# Patient Record
Sex: Female | Born: 1965 | Race: Black or African American | Hispanic: No | Marital: Single | Smoking: Former smoker
Health system: Southern US, Community
[De-identification: ages and names within clinical notes are randomized; demographics above are authoritative.]

## PROBLEM LIST (undated history)

## (undated) DIAGNOSIS — K469 Unspecified abdominal hernia without obstruction or gangrene: Secondary | ICD-10-CM

## (undated) DIAGNOSIS — F329 Major depressive disorder, single episode, unspecified: Secondary | ICD-10-CM

## (undated) DIAGNOSIS — F1020 Alcohol dependence, uncomplicated: Secondary | ICD-10-CM

## (undated) DIAGNOSIS — I10 Essential (primary) hypertension: Secondary | ICD-10-CM

## (undated) DIAGNOSIS — N920 Excessive and frequent menstruation with regular cycle: Secondary | ICD-10-CM

## (undated) DIAGNOSIS — D219 Benign neoplasm of connective and other soft tissue, unspecified: Secondary | ICD-10-CM

## (undated) DIAGNOSIS — D649 Anemia, unspecified: Secondary | ICD-10-CM

## (undated) DIAGNOSIS — F32A Depression, unspecified: Secondary | ICD-10-CM

## (undated) HISTORY — PX: ABDOMINAL HYSTERECTOMY: SHX81

## (undated) HISTORY — DX: Depression, unspecified: F32.A

## (undated) HISTORY — DX: Anemia, unspecified: D64.9

## (undated) HISTORY — PX: CHOLECYSTECTOMY: SHX55

## (undated) HISTORY — DX: Excessive and frequent menstruation with regular cycle: N92.0

## (undated) HISTORY — DX: Major depressive disorder, single episode, unspecified: F32.9

## (undated) HISTORY — DX: Unspecified abdominal hernia without obstruction or gangrene: K46.9

## (undated) HISTORY — DX: Benign neoplasm of connective and other soft tissue, unspecified: D21.9

---

## 2008-08-24 ENCOUNTER — Emergency Department (HOSPITAL_BASED_OUTPATIENT_CLINIC_OR_DEPARTMENT_OTHER): Admission: EM | Admit: 2008-08-24 | Discharge: 2008-08-24 | Payer: Self-pay | Admitting: Emergency Medicine

## 2008-08-24 ENCOUNTER — Ambulatory Visit: Payer: Self-pay | Admitting: Radiology

## 2008-09-20 ENCOUNTER — Emergency Department (HOSPITAL_BASED_OUTPATIENT_CLINIC_OR_DEPARTMENT_OTHER): Admission: EM | Admit: 2008-09-20 | Discharge: 2008-09-21 | Payer: Self-pay | Admitting: Emergency Medicine

## 2009-07-09 ENCOUNTER — Inpatient Hospital Stay (HOSPITAL_COMMUNITY): Admission: AD | Admit: 2009-07-09 | Discharge: 2009-07-12 | Payer: Self-pay | Admitting: Family Medicine

## 2009-07-09 ENCOUNTER — Ambulatory Visit: Payer: Self-pay | Admitting: Obstetrics & Gynecology

## 2010-06-12 LAB — CBC
HCT: 20.7 % — ABNORMAL LOW (ref 36.0–46.0)
Hemoglobin: 6.1 g/dL — CL (ref 12.0–15.0)
Hemoglobin: 6.1 g/dL — CL (ref 12.0–15.0)
MCHC: 29.5 g/dL — ABNORMAL LOW (ref 30.0–36.0)
MCV: 58.4 fL — ABNORMAL LOW (ref 78.0–100.0)
MCV: 58.7 fL — ABNORMAL LOW (ref 78.0–100.0)
MCV: 59.5 fL — ABNORMAL LOW (ref 78.0–100.0)
Platelets: 213 10*3/uL (ref 150–400)
Platelets: 218 10*3/uL (ref 150–400)
RBC: 3.44 MIL/uL — ABNORMAL LOW (ref 3.87–5.11)
RBC: 3.48 MIL/uL — ABNORMAL LOW (ref 3.87–5.11)
RDW: 24.1 % — ABNORMAL HIGH (ref 11.5–15.5)
RDW: 24.3 % — ABNORMAL HIGH (ref 11.5–15.5)
WBC: 12.4 10*3/uL — ABNORMAL HIGH (ref 4.0–10.5)

## 2010-06-12 LAB — COMPREHENSIVE METABOLIC PANEL
AST: 46 U/L — ABNORMAL HIGH (ref 0–37)
AST: 91 U/L — ABNORMAL HIGH (ref 0–37)
Albumin: 2.5 g/dL — ABNORMAL LOW (ref 3.5–5.2)
Alkaline Phosphatase: 47 U/L (ref 39–117)
BUN: 6 mg/dL (ref 6–23)
CO2: 25 mEq/L (ref 19–32)
Calcium: 7.9 mg/dL — ABNORMAL LOW (ref 8.4–10.5)
Calcium: 8.1 mg/dL — ABNORMAL LOW (ref 8.4–10.5)
Chloride: 97 mEq/L (ref 96–112)
Creatinine, Ser: 0.91 mg/dL (ref 0.4–1.2)
Creatinine, Ser: 1.01 mg/dL (ref 0.4–1.2)
GFR calc Af Amer: >60 mL/min (ref >60)
GFR calc non Af Amer: >60 mL/min (ref >60)
Potassium: 2.6 mEq/L — CL (ref 3.5–5.1)
Sodium: 133 mEq/L — ABNORMAL LOW (ref 135–145)
Total Bilirubin: 0.3 mg/dL (ref 0.3–1.2)
Total Bilirubin: 0.4 mg/dL (ref 0.3–1.2)
Total Protein: 6.1 g/dL (ref 6.0–8.3)
Total Protein: 6.5 g/dL (ref 6.0–8.3)

## 2010-06-12 LAB — URINALYSIS, ROUTINE W REFLEX MICROSCOPIC
Bilirubin Urine: NEGATIVE
Glucose, UA: NEGATIVE mg/dL
Hgb urine dipstick: NEGATIVE
Ketones, ur: NEGATIVE mg/dL
Protein, ur: NEGATIVE mg/dL
pH: 6 (ref 5.0–8.0)

## 2010-06-12 LAB — POCT PREGNANCY, URINE: Preg Test, Ur: NEGATIVE

## 2010-06-12 LAB — RAPID URINE DRUG SCREEN, HOSP PERFORMED
Amphetamines: NOT DETECTED
Benzodiazepines: NOT DETECTED
Cocaine: POSITIVE — AB
Tetrahydrocannabinol: NOT DETECTED

## 2010-06-12 LAB — HEPATITIS C VRS RNA DETECT BY PCR-QUAL: Hepatitis C Vrs RNA by PCR-Qual: POSITIVE — AB

## 2010-06-12 LAB — RPR: RPR Ser Ql: NONREACTIVE

## 2010-06-12 LAB — WET PREP, GENITAL
Trich, Wet Prep: NONE SEEN
Yeast Wet Prep HPF POC: NONE SEEN

## 2010-06-12 LAB — PROTIME-INR: Prothrombin Time: 14 seconds (ref 11.6–15.2)

## 2010-07-02 LAB — BASIC METABOLIC PANEL
CO2: 27 mEq/L (ref 19–32)
Calcium: 9.1 mg/dL (ref 8.4–10.5)
GFR calc non Af Amer: 50 mL/min — ABNORMAL LOW (ref >60)
Glucose, Bld: 94 mg/dL (ref 70–99)
Sodium: 141 mEq/L (ref 135–145)

## 2010-07-02 LAB — URINE MICROSCOPIC-ADD ON

## 2010-07-02 LAB — CBC
Hemoglobin: 8.4 g/dL — ABNORMAL LOW (ref 12.0–15.0)
MCHC: 30 g/dL (ref 30.0–36.0)
MCV: 62.3 fL — ABNORMAL LOW (ref 78.0–100.0)
Platelets: 362 10*3/uL (ref 150–400)
RDW: 20.2 % — ABNORMAL HIGH (ref 11.5–15.5)
WBC: 5.5 10*3/uL (ref 4.0–10.5)

## 2010-07-02 LAB — URINALYSIS, ROUTINE W REFLEX MICROSCOPIC
Bilirubin Urine: NEGATIVE
Protein, ur: NEGATIVE mg/dL
Specific Gravity, Urine: 1.007 (ref 1.005–1.030)
pH: 6 (ref 5.0–8.0)

## 2010-07-02 LAB — DIFFERENTIAL
Basophils Relative: 0 % (ref 0–1)
Eosinophils Absolute: 0.1 10*3/uL (ref 0.0–0.7)
Monocytes Absolute: 0.4 10*3/uL (ref 0.1–1.0)
Neutrophils Relative %: 64 % (ref 43–77)
Promyelocytes Absolute: 0 %

## 2010-07-02 LAB — PREGNANCY, URINE: Preg Test, Ur: NEGATIVE

## 2010-10-01 ENCOUNTER — Emergency Department (HOSPITAL_COMMUNITY)
Admission: EM | Admit: 2010-10-01 | Discharge: 2010-10-02 | Disposition: A | Discharge status: Home / Self Care | Payer: No Typology Code available for payment source | Attending: Emergency Medicine | Admitting: Emergency Medicine

## 2010-10-01 DIAGNOSIS — R030 Elevated blood-pressure reading, without diagnosis of hypertension: Secondary | ICD-10-CM | POA: Insufficient documentation

## 2010-10-01 DIAGNOSIS — M25579 Pain in unspecified ankle and joints of unspecified foot: Secondary | ICD-10-CM | POA: Insufficient documentation

## 2010-10-01 DIAGNOSIS — R51 Headache: Secondary | ICD-10-CM | POA: Insufficient documentation

## 2010-10-01 DIAGNOSIS — M533 Sacrococcygeal disorders, not elsewhere classified: Secondary | ICD-10-CM | POA: Insufficient documentation

## 2010-10-01 DIAGNOSIS — M79609 Pain in unspecified limb: Secondary | ICD-10-CM | POA: Insufficient documentation

## 2010-10-01 DIAGNOSIS — Z8659 Personal history of other mental and behavioral disorders: Secondary | ICD-10-CM | POA: Insufficient documentation

## 2010-10-01 DIAGNOSIS — S139XXA Sprain of joints and ligaments of unspecified parts of neck, initial encounter: Secondary | ICD-10-CM | POA: Insufficient documentation

## 2010-10-01 DIAGNOSIS — T07XXXA Unspecified multiple injuries, initial encounter: Secondary | ICD-10-CM | POA: Insufficient documentation

## 2010-10-01 DIAGNOSIS — F319 Bipolar disorder, unspecified: Secondary | ICD-10-CM | POA: Insufficient documentation

## 2010-10-01 DIAGNOSIS — M542 Cervicalgia: Secondary | ICD-10-CM | POA: Insufficient documentation

## 2010-10-01 DIAGNOSIS — IMO0002 Reserved for concepts with insufficient information to code with codable children: Secondary | ICD-10-CM | POA: Insufficient documentation

## 2010-10-01 DIAGNOSIS — S060X0A Concussion without loss of consciousness, initial encounter: Secondary | ICD-10-CM | POA: Insufficient documentation

## 2010-10-01 DIAGNOSIS — M25569 Pain in unspecified knee: Secondary | ICD-10-CM | POA: Insufficient documentation

## 2010-10-02 ENCOUNTER — Emergency Department (HOSPITAL_COMMUNITY): Payer: No Typology Code available for payment source

## 2010-10-09 ENCOUNTER — Other Ambulatory Visit (HOSPITAL_COMMUNITY): Payer: Self-pay | Admitting: Neurosurgery

## 2010-10-09 DIAGNOSIS — M542 Cervicalgia: Secondary | ICD-10-CM

## 2010-10-16 ENCOUNTER — Other Ambulatory Visit (HOSPITAL_COMMUNITY): Payer: Self-pay | Admitting: Neurosurgery

## 2010-10-16 ENCOUNTER — Ambulatory Visit (HOSPITAL_COMMUNITY)
Admission: RE | Admit: 2010-10-16 | Discharge: 2010-10-16 | Disposition: A | Discharge status: Home / Self Care | Payer: No Typology Code available for payment source | Source: Ambulatory Visit | Attending: Neurosurgery | Admitting: Neurosurgery

## 2010-10-16 DIAGNOSIS — M542 Cervicalgia: Secondary | ICD-10-CM | POA: Insufficient documentation

## 2010-10-16 DIAGNOSIS — M5020 Other cervical disc displacement, unspecified cervical region: Secondary | ICD-10-CM | POA: Insufficient documentation

## 2010-10-22 ENCOUNTER — Inpatient Hospital Stay (HOSPITAL_COMMUNITY)
Admission: RE | Admit: 2010-10-22 | Discharge: 2010-10-22 | Payer: No Typology Code available for payment source | Source: Ambulatory Visit | Attending: Neurosurgery | Admitting: Neurosurgery

## 2011-06-17 ENCOUNTER — Inpatient Hospital Stay (HOSPITAL_COMMUNITY)
Admission: EM | Admit: 2011-06-17 | Discharge: 2011-06-21 | DRG: 690 | Disposition: A | Discharge status: Home / Self Care | Payer: Medicaid Other | Attending: Internal Medicine | Admitting: Internal Medicine

## 2011-06-17 ENCOUNTER — Emergency Department (HOSPITAL_COMMUNITY): Payer: Medicaid Other

## 2011-06-17 ENCOUNTER — Encounter (HOSPITAL_COMMUNITY): Payer: Self-pay | Admitting: Emergency Medicine

## 2011-06-17 DIAGNOSIS — N920 Excessive and frequent menstruation with regular cycle: Secondary | ICD-10-CM | POA: Diagnosis present

## 2011-06-17 DIAGNOSIS — F411 Generalized anxiety disorder: Secondary | ICD-10-CM | POA: Diagnosis present

## 2011-06-17 DIAGNOSIS — E876 Hypokalemia: Secondary | ICD-10-CM

## 2011-06-17 DIAGNOSIS — F1020 Alcohol dependence, uncomplicated: Secondary | ICD-10-CM

## 2011-06-17 DIAGNOSIS — E860 Dehydration: Secondary | ICD-10-CM | POA: Diagnosis present

## 2011-06-17 DIAGNOSIS — F17200 Nicotine dependence, unspecified, uncomplicated: Secondary | ICD-10-CM | POA: Diagnosis present

## 2011-06-17 DIAGNOSIS — N10 Acute tubulo-interstitial nephritis: Principal | ICD-10-CM

## 2011-06-17 DIAGNOSIS — F1410 Cocaine abuse, uncomplicated: Secondary | ICD-10-CM | POA: Diagnosis present

## 2011-06-17 DIAGNOSIS — I10 Essential (primary) hypertension: Secondary | ICD-10-CM | POA: Diagnosis present

## 2011-06-17 DIAGNOSIS — A498 Other bacterial infections of unspecified site: Secondary | ICD-10-CM | POA: Diagnosis present

## 2011-06-17 DIAGNOSIS — D259 Leiomyoma of uterus, unspecified: Secondary | ICD-10-CM

## 2011-06-17 DIAGNOSIS — D500 Iron deficiency anemia secondary to blood loss (chronic): Secondary | ICD-10-CM | POA: Diagnosis present

## 2011-06-17 DIAGNOSIS — IMO0001 Reserved for inherently not codable concepts without codable children: Secondary | ICD-10-CM

## 2011-06-17 DIAGNOSIS — N179 Acute kidney failure, unspecified: Secondary | ICD-10-CM

## 2011-06-17 DIAGNOSIS — Z720 Tobacco use: Secondary | ICD-10-CM

## 2011-06-17 DIAGNOSIS — R197 Diarrhea, unspecified: Secondary | ICD-10-CM | POA: Diagnosis present

## 2011-06-17 HISTORY — DX: Essential (primary) hypertension: I10

## 2011-06-17 LAB — PROTIME-INR
INR: 0.99 (ref 0.00–1.49)
Prothrombin Time: 13.3 seconds (ref 11.6–15.2)

## 2011-06-17 LAB — URINALYSIS, ROUTINE W REFLEX MICROSCOPIC
Ketones, ur: NEGATIVE mg/dL
Nitrite: NEGATIVE
Protein, ur: 100 mg/dL — AB

## 2011-06-17 LAB — CBC
HCT: 30.2 % — ABNORMAL LOW (ref 36.0–46.0)
MCHC: 31.8 g/dL (ref 30.0–36.0)
RDW: 21.2 % — ABNORMAL HIGH (ref 11.5–15.5)

## 2011-06-17 LAB — URINE MICROSCOPIC-ADD ON

## 2011-06-17 LAB — DIFFERENTIAL
Basophils Absolute: 0 10*3/uL (ref 0.0–0.1)
Eosinophils Absolute: 0 10*3/uL (ref 0.0–0.7)
Lymphocytes Relative: 9 % — ABNORMAL LOW (ref 12–46)
Lymphs Abs: 1 10*3/uL (ref 0.7–4.0)
Neutro Abs: 8.9 10*3/uL — ABNORMAL HIGH (ref 1.7–7.7)

## 2011-06-17 LAB — COMPREHENSIVE METABOLIC PANEL
BUN: 17 mg/dL (ref 6–23)
Calcium: 9.1 mg/dL (ref 8.4–10.5)
GFR calc Af Amer: 59 mL/min — ABNORMAL LOW (ref >90)
Glucose, Bld: 125 mg/dL — ABNORMAL HIGH (ref 70–99)
Sodium: 130 mEq/L — ABNORMAL LOW (ref 135–145)
Total Protein: 8.3 g/dL (ref 6.0–8.3)

## 2011-06-17 LAB — LIPASE, BLOOD: Lipase: 68 U/L — ABNORMAL HIGH (ref 11–59)

## 2011-06-17 LAB — PREGNANCY, URINE: Preg Test, Ur: NEGATIVE

## 2011-06-17 MED ORDER — MORPHINE SULFATE 4 MG/ML IJ SOLN
4.0000 mg | Freq: Once | INTRAMUSCULAR | Status: AC
  Administered 2011-06-17: 4 mg via INTRAVENOUS
  Filled 2011-06-17: qty 1

## 2011-06-17 MED ORDER — ACETAMINOPHEN 325 MG PO TABS
650.0000 mg | ORAL_TABLET | Freq: Once | ORAL | Status: AC
  Administered 2011-06-17: 650 mg via ORAL
  Filled 2011-06-17: qty 2

## 2011-06-17 MED ORDER — IOHEXOL 300 MG/ML  SOLN
20.0000 mL | INTRAMUSCULAR | Status: AC
  Administered 2011-06-18: 20 mL via ORAL

## 2011-06-17 MED ORDER — LORAZEPAM 2 MG/ML IJ SOLN
1.0000 mg | Freq: Once | INTRAMUSCULAR | Status: AC
  Administered 2011-06-17: 1 mg via INTRAVENOUS
  Filled 2011-06-17: qty 1

## 2011-06-17 MED ORDER — ONDANSETRON HCL 4 MG/2ML IJ SOLN
4.0000 mg | Freq: Once | INTRAMUSCULAR | Status: AC
  Administered 2011-06-17: 4 mg via INTRAVENOUS
  Filled 2011-06-17: qty 2

## 2011-06-17 MED ORDER — SODIUM CHLORIDE 0.9 % IV BOLUS (SEPSIS)
1000.0000 mL | Freq: Once | INTRAVENOUS | Status: AC
  Administered 2011-06-17: 1000 mL via INTRAVENOUS

## 2011-06-17 NOTE — ED Notes (Signed)
Per ems-  Pt coming from home where she has been having pain all over but worse in her abdomen.  Pt having nausea, vomiting, and diarrhea. Pt states she had a small amount of rectal bleeding on Friday.

## 2011-06-17 NOTE — ED Notes (Signed)
Pt states that she has not drank any alcohol in the last three days. Pt states she normally drinks "a lot"

## 2011-06-17 NOTE — ED Notes (Signed)
Pt to CT

## 2011-06-17 NOTE — ED Notes (Signed)
Patient back from CT.

## 2011-06-17 NOTE — ED Notes (Signed)
Pt crying in pain and very restless and rolling around in bed.

## 2011-06-17 NOTE — ED Notes (Signed)
Pt back from CT

## 2011-06-18 ENCOUNTER — Encounter (HOSPITAL_COMMUNITY): Payer: Self-pay | Admitting: Internal Medicine

## 2011-06-18 DIAGNOSIS — E876 Hypokalemia: Secondary | ICD-10-CM | POA: Diagnosis present

## 2011-06-18 DIAGNOSIS — N179 Acute kidney failure, unspecified: Secondary | ICD-10-CM | POA: Diagnosis present

## 2011-06-18 DIAGNOSIS — N10 Acute tubulo-interstitial nephritis: Secondary | ICD-10-CM | POA: Diagnosis present

## 2011-06-18 DIAGNOSIS — F1020 Alcohol dependence, uncomplicated: Secondary | ICD-10-CM | POA: Diagnosis present

## 2011-06-18 DIAGNOSIS — Z720 Tobacco use: Secondary | ICD-10-CM | POA: Diagnosis present

## 2011-06-18 LAB — CBC
HCT: 26.1 % — ABNORMAL LOW (ref 36.0–46.0)
Hemoglobin: 8.1 g/dL — ABNORMAL LOW (ref 12.0–15.0)
RBC: 4.24 MIL/uL (ref 3.87–5.11)
WBC: 8.2 10*3/uL (ref 4.0–10.5)

## 2011-06-18 LAB — RETICULOCYTES
RBC.: 3.92 MIL/uL (ref 3.87–5.11)
Retic Ct Pct: <0.4 % — ABNORMAL LOW (ref 0.4–3.1)

## 2011-06-18 LAB — MAGNESIUM: Magnesium: 1.9 mg/dL (ref 1.5–2.5)

## 2011-06-18 LAB — ETHANOL: Alcohol, Ethyl (B): <11 mg/dL (ref 0–11)

## 2011-06-18 LAB — COMPREHENSIVE METABOLIC PANEL
Albumin: 2.4 g/dL — ABNORMAL LOW (ref 3.5–5.2)
BUN: 15 mg/dL (ref 6–23)
Calcium: 8.5 mg/dL (ref 8.4–10.5)
GFR calc Af Amer: 65 mL/min — ABNORMAL LOW (ref >90)
Glucose, Bld: 167 mg/dL — ABNORMAL HIGH (ref 70–99)
Total Protein: 7 g/dL (ref 6.0–8.3)

## 2011-06-18 MED ORDER — KCL IN DEXTROSE-NACL 20-5-0.9 MEQ/L-%-% IV SOLN
INTRAVENOUS | Status: DC
  Filled 2011-06-18 (×3): qty 1000

## 2011-06-18 MED ORDER — VITAMIN B-1 100 MG PO TABS
100.0000 mg | ORAL_TABLET | Freq: Every day | ORAL | Status: DC
  Administered 2011-06-18 – 2011-06-21 (×4): 100 mg via ORAL
  Filled 2011-06-18 (×4): qty 1

## 2011-06-18 MED ORDER — ADULT MULTIVITAMIN W/MINERALS CH
1.0000 | ORAL_TABLET | Freq: Every day | ORAL | Status: DC
  Administered 2011-06-18 – 2011-06-21 (×4): 1 via ORAL
  Filled 2011-06-18 (×4): qty 1

## 2011-06-18 MED ORDER — IOHEXOL 300 MG/ML  SOLN
100.0000 mL | Freq: Once | INTRAMUSCULAR | Status: AC | PRN
  Administered 2011-06-18: 100 mL via INTRAVENOUS

## 2011-06-18 MED ORDER — CIPROFLOXACIN IN D5W 400 MG/200ML IV SOLN
400.0000 mg | Freq: Once | INTRAVENOUS | Status: AC
  Administered 2011-06-18: 400 mg via INTRAVENOUS
  Filled 2011-06-18: qty 200

## 2011-06-18 MED ORDER — ALBUTEROL SULFATE (5 MG/ML) 0.5% IN NEBU: 2.5000 mg | INHALATION_SOLUTION | RESPIRATORY_TRACT | Status: DC | PRN

## 2011-06-18 MED ORDER — LORAZEPAM 2 MG/ML IJ SOLN
1.0000 mg | Freq: Four times a day (QID) | INTRAMUSCULAR | Status: AC | PRN
  Administered 2011-06-18: 1 mg via INTRAVENOUS
  Filled 2011-06-18 (×3): qty 1

## 2011-06-18 MED ORDER — ONDANSETRON HCL 4 MG PO TABS: 4.0000 mg | ORAL_TABLET | Freq: Four times a day (QID) | ORAL | Status: DC | PRN

## 2011-06-18 MED ORDER — POTASSIUM CHLORIDE 10 MEQ/100ML IV SOLN
10.0000 meq | INTRAVENOUS | Status: AC
  Administered 2011-06-18 (×4): 10 meq via INTRAVENOUS
  Filled 2011-06-18 (×4): qty 100

## 2011-06-18 MED ORDER — THIAMINE HCL 100 MG/ML IJ SOLN
100.0000 mg | Freq: Every day | INTRAMUSCULAR | Status: DC
  Filled 2011-06-18 (×3): qty 1

## 2011-06-18 MED ORDER — DOCUSATE SODIUM 100 MG PO CAPS
100.0000 mg | ORAL_CAPSULE | Freq: Two times a day (BID) | ORAL | Status: DC
  Administered 2011-06-18 (×2): 100 mg via ORAL
  Filled 2011-06-18 (×4): qty 1

## 2011-06-18 MED ORDER — FOLIC ACID 1 MG PO TABS
1.0000 mg | ORAL_TABLET | Freq: Every day | ORAL | Status: DC
  Administered 2011-06-18 – 2011-06-21 (×4): 1 mg via ORAL
  Filled 2011-06-18 (×4): qty 1

## 2011-06-18 MED ORDER — MORPHINE SULFATE 4 MG/ML IJ SOLN
4.0000 mg | Freq: Once | INTRAMUSCULAR | Status: AC
  Administered 2011-06-18: 4 mg via INTRAVENOUS
  Filled 2011-06-18: qty 1

## 2011-06-18 MED ORDER — POTASSIUM CHLORIDE CRYS ER 20 MEQ PO TBCR
40.0000 meq | EXTENDED_RELEASE_TABLET | Freq: Once | ORAL | Status: AC
  Administered 2011-06-18: 40 meq via ORAL
  Filled 2011-06-18: qty 2

## 2011-06-18 MED ORDER — ACETAMINOPHEN 650 MG RE SUPP: 650.0000 mg | Freq: Four times a day (QID) | RECTAL | Status: DC | PRN

## 2011-06-18 MED ORDER — MORPHINE SULFATE 4 MG/ML IJ SOLN
4.0000 mg | INTRAMUSCULAR | Status: DC | PRN
  Administered 2011-06-18 – 2011-06-21 (×12): 4 mg via INTRAVENOUS
  Filled 2011-06-18 (×12): qty 1

## 2011-06-18 MED ORDER — POTASSIUM PHOSPHATE DIBASIC 3 MMOLE/ML IV SOLN
20.0000 mmol | Freq: Once | INTRAVENOUS | Status: AC
  Administered 2011-06-18: 20 mmol via INTRAVENOUS
  Filled 2011-06-18: qty 6.67

## 2011-06-18 MED ORDER — LORAZEPAM 2 MG/ML IJ SOLN
0.5000 mg | Freq: Once | INTRAMUSCULAR | Status: AC
  Administered 2011-06-18: 0.5 mg via INTRAVENOUS
  Filled 2011-06-18: qty 1

## 2011-06-18 MED ORDER — PIPERACILLIN-TAZOBACTAM 3.375 G IVPB
3.3750 g | Freq: Three times a day (TID) | INTRAVENOUS | Status: DC
  Administered 2011-06-18 – 2011-06-20 (×7): 3.375 g via INTRAVENOUS
  Filled 2011-06-18 (×9): qty 50

## 2011-06-18 MED ORDER — SODIUM CHLORIDE 0.9 % IV SOLN: INTRAVENOUS | Status: AC

## 2011-06-18 MED ORDER — LORAZEPAM 2 MG/ML IJ SOLN: 0.0000 mg | Freq: Two times a day (BID) | INTRAMUSCULAR | Status: DC

## 2011-06-18 MED ORDER — ONDANSETRON HCL 4 MG/2ML IJ SOLN: 4.0000 mg | Freq: Three times a day (TID) | INTRAMUSCULAR | Status: DC | PRN

## 2011-06-18 MED ORDER — NICOTINE 21 MG/24HR TD PT24
21.0000 mg | MEDICATED_PATCH | Freq: Every day | TRANSDERMAL | Status: DC
  Filled 2011-06-18: qty 1

## 2011-06-18 MED ORDER — ACETAMINOPHEN 325 MG PO TABS
650.0000 mg | ORAL_TABLET | Freq: Four times a day (QID) | ORAL | Status: DC | PRN
  Administered 2011-06-21: 650 mg via ORAL
  Filled 2011-06-18 (×2): qty 2

## 2011-06-18 MED ORDER — MORPHINE SULFATE 2 MG/ML IJ SOLN
1.0000 mg | INTRAMUSCULAR | Status: DC | PRN
  Administered 2011-06-18 (×3): 1 mg via INTRAVENOUS
  Filled 2011-06-18 (×3): qty 1

## 2011-06-18 MED ORDER — LORAZEPAM 1 MG PO TABS: 1.0000 mg | ORAL_TABLET | Freq: Four times a day (QID) | ORAL | Status: AC | PRN

## 2011-06-18 MED ORDER — LORAZEPAM 2 MG/ML IJ SOLN
0.0000 mg | Freq: Four times a day (QID) | INTRAMUSCULAR | Status: AC
  Administered 2011-06-18 – 2011-06-19 (×4): 2 mg via INTRAVENOUS
  Filled 2011-06-18 (×2): qty 1

## 2011-06-18 MED ORDER — ONDANSETRON HCL 4 MG/2ML IJ SOLN
4.0000 mg | Freq: Four times a day (QID) | INTRAMUSCULAR | Status: DC | PRN
  Administered 2011-06-19: 4 mg via INTRAVENOUS
  Filled 2011-06-18: qty 2

## 2011-06-18 MED ORDER — THIAMINE HCL 100 MG/ML IJ SOLN
Freq: Once | INTRAVENOUS | Status: AC
  Administered 2011-06-18: 05:00:00 via INTRAVENOUS
  Filled 2011-06-18: qty 1000

## 2011-06-18 MED ORDER — SODIUM CHLORIDE 0.9 % IV SOLN
INTRAVENOUS | Status: DC
  Administered 2011-06-18: 17:00:00 via INTRAVENOUS
  Administered 2011-06-19: 1000 mL via INTRAVENOUS
  Administered 2011-06-19: 10:00:00 via INTRAVENOUS

## 2011-06-18 MED ORDER — NICOTINE 21 MG/24HR TD PT24
21.0000 mg | MEDICATED_PATCH | TRANSDERMAL | Status: DC
  Administered 2011-06-18 – 2011-06-20 (×3): 21 mg via TRANSDERMAL
  Filled 2011-06-18 (×4): qty 1

## 2011-06-18 MED ORDER — ALUM & MAG HYDROXIDE-SIMETH 200-200-20 MG/5ML PO SUSP: 30.0000 mL | Freq: Four times a day (QID) | ORAL | Status: DC | PRN

## 2011-06-18 NOTE — ED Provider Notes (Signed)
History     CSN: [id number]  Arrival date & time 06/17/11  1746   First MD Initiated Contact with Patient 06/17/11 1804      Chief Complaint  Patient presents with  . Abdominal Pain    (Consider location/radiation/quality/duration/timing/severity/associated sxs/prior treatment) Patient is a 46 y.o. female presenting with abdominal pain. The history is provided by the patient.  Abdominal Pain The primary symptoms of the illness include abdominal pain, fever, nausea, vomiting and diarrhea. The primary symptoms of the illness do not include shortness of breath or dysuria. Episode onset: 3 days ago. The onset of the illness was gradual. The problem has been gradually worsening.  The illness is associated with alcohol use. The patient states that she believes she is currently not pregnant. The patient has had a change in bowel habit. Additional symptoms associated with the illness include chills, anorexia and back pain. Symptoms associated with the illness do not include urgency, hematuria or frequency. Associated symptoms comments: She reports having high fevers, unmeasured.  Also reports "head to toe pain".  She does have a history of heavy EtOH use, daily but no alcohol in 3 days.  She has had nausea vomiting and diarrhea.  She states she saw a small amount of pink streaking in her otherwise clear to yellow liquid stools 3 days ago, but none since..    Past Medical History  Diagnosis Date  . Hypertension     Past Surgical History  Procedure Date  . Cholecystectomy     History reviewed. No pertinent family history.  History  Substance Use Topics  . Smoking status: Current Everyday Smoker -- 2.0 packs/day    Types: Cigarettes  . Smokeless tobacco: Not on file  . Alcohol Use: Yes     pt states she drinks everyday "a lot"    OB History    Grav Para Term Preterm Abortions TAB SAB Ect Mult Living                  Review of Systems  Constitutional: Positive for fever and  chills.  HENT: Negative for congestion, sore throat and neck pain.   Eyes: Negative.   Respiratory: Negative for chest tightness and shortness of breath.   Cardiovascular: Negative for chest pain.  Gastrointestinal: Positive for nausea, vomiting, abdominal pain, diarrhea and anorexia. Negative for rectal pain.  Genitourinary: Negative.  Negative for dysuria, urgency, frequency, hematuria, difficulty urinating and vaginal pain.  Musculoskeletal: Positive for myalgias and back pain. Negative for joint swelling and arthralgias.  Skin: Negative.  Negative for rash and wound.  Neurological: Negative for dizziness, weakness, light-headedness, numbness and headaches.  Hematological: Negative.   Psychiatric/Behavioral: Negative.     Allergies  Review of patient's allergies indicates no known allergies.  Home Medications  No current outpatient prescriptions on file.  BP 129/82  Pulse 78  Temp(Src) 100.3 F (37.9 C) (Axillary)  Resp 24  Ht 5\' 9"  (1.753 m)  Wt 180 lb (81.647 kg)  BMI 26.58 kg/m2  SpO2 99%  LMP 05/30/2011  Physical Exam  Nursing note and vitals reviewed. Constitutional: She is oriented to person, place, and time. She appears well-developed and well-nourished.  HENT:  Head: Normocephalic and atraumatic.  Eyes: Conjunctivae are normal.  Neck: Normal range of motion.  Cardiovascular: Normal rate, regular rhythm, normal heart sounds and intact distal pulses.   Pulmonary/Chest: Effort normal and breath sounds normal. She has no wheezes.  Abdominal: Bowel sounds are normal. She exhibits no distension and no mass.  There is generalized tenderness. There is CVA tenderness. There is no rigidity, no rebound and no guarding.  Musculoskeletal: Normal range of motion.  Neurological: She is alert and oriented to person, place, and time.  Skin: Skin is warm and dry.  Psychiatric: Her mood appears anxious. She is agitated.    ED Course  Procedures (including critical care  time)  Labs Reviewed  CBC - Abnormal; Notable for the following:    WBC 11.0 (*)    Hemoglobin 9.6 (*)    HCT 30.2 (*)    MCV 60.6 (*)    MCH 19.3 (*)    RDW 21.2 (*)    Platelets 149 (*)    All other components within normal limits  DIFFERENTIAL - Abnormal; Notable for the following:    Neutrophils Relative 81 (*)    Lymphocytes Relative 9 (*)    Neutro Abs 8.9 (*)    Monocytes Absolute 1.1 (*)    All other components within normal limits  COMPREHENSIVE METABOLIC PANEL - Abnormal; Notable for the following:    Sodium 130 (*)    Potassium 3.4 (*)    Chloride 91 (*)    Glucose, Bld 125 (*)    Creatinine, Ser 1.26 (*)    Albumin 2.8 (*)    GFR calc non Af Amer 51 (*)    GFR calc Af Amer 59 (*)    All other components within normal limits  LIPASE, BLOOD - Abnormal; Notable for the following:    Lipase 68 (*)    All other components within normal limits  URINALYSIS, ROUTINE W REFLEX MICROSCOPIC - Abnormal; Notable for the following:    APPearance CLOUDY (*)    Hgb urine dipstick MODERATE (*)    Protein, ur 100 (*)    Leukocytes, UA SMALL (*)    All other components within normal limits  PROTIME-INR  PREGNANCY, URINE  URINE MICROSCOPIC-ADD ON  URINE CULTURE   Ct Abdomen Pelvis W Contrast  06/18/2011  *RADIOLOGY REPORT*  Clinical Data: Bilateral mid to lower abdominal pain, nausea and vomiting.  CT ABDOMEN AND PELVIS WITH CONTRAST  Technique:  Multidetector CT imaging of the abdomen and pelvis was performed following the standard protocol during bolus administration of intravenous contrast.  Contrast:  100 mL of Omnipaque 300 IV contrast  Comparison: Abdominal radiograph performed earlier today at 07:29 p.m.  Findings: Minimal bibasilar atelectasis is noted.  Minimal prominence of the intrahepatic biliary ducts remains within normal limits status post cholecystectomy.  The liver is otherwise unremarkable in appearance.  The spleen is unremarkable.  Clips are noted at the  gallbladder fossa.  The pancreas and adrenal glands are unremarkable.  There is diffuse heterogeneity and decreased enhancement of both kidneys, compatible with bilateral pyelonephritis.  No focal masses are identified.  Minimal associated perinephric stranding is noted. There is no evidence of hydronephrosis.  No renal or ureteral stones are identified.  No free fluid is identified.  The small bowel is unremarkable in appearance.  The stomach is within normal limits.  No acute vascular abnormalities are seen.  The appendix is normal in caliber and contains minimal air, without evidence for appendicitis.  The colon is grossly unremarkable in appearance.  The bladder is mildly distended, though displaced by the patient's enlarged fibroid uterus.  A significantly enlarged uterus is noted, largely filling the pelvis and extending to the level of the umbilicus, with multiple prominent heterogeneous and partially calcified fibroids.  No inguinal lymphadenopathy is seen.  No acute  osseous abnormalities are identified.  Vacuum phenomenon and disc space narrowing are noted at L5-S1, with associated endplate irregularity and sclerosis.  IMPRESSION:  1.  Diffuse acute bilateral pyelonephritis noted. 2.  Significantly enlarged fibroid uterus largely fills the pelvis and extends to the level of the umbilicus. 3.  Mild degenerative change at L5-S1.  Original Report Authenticated By: Tonia Ghent, M.D.   Dg Abd Acute W/chest  06/17/2011  *RADIOLOGY REPORT*  Clinical Data: Abdominal pain  ACUTE ABDOMEN SERIES (ABDOMEN 2 VIEW & CHEST 1 VIEW)  Comparison: None  Findings: Heart size is normal.  No pleural effusion or pulmonary edema.  No airspace consolidation.  Bowel gas pattern is nonspecific.  There are several dilated loops of central small bowel which measure up to 2.9 cm.  There are multiple fluid levels identified within the left abdomen.  There is a paucity of colonic gas.  IMPRESSION:  1.  Mildly dilated loops of small  bowel and air-fluid levels. Cannot rule out low grade partial obstruction or small bowel ileus. 2.  No active cardiopulmonary abnormalities.  Original Report Authenticated By: Rosealee Albee, M.D.     1. Pyelonephritis, acute   2. Alcohol dependence   3. Uterine fibroid       MDM  Call placed to Dr. Hanley Hays with [facility] who will admit this patient for further management.  Patient was given Cipro 400 mg IV.  She's also given multiple doses of morphine in the department for pain management.  Temporary admission orders were placed.        Candis Musa, PA 06/18/11 0139

## 2011-06-18 NOTE — Progress Notes (Signed)
CRITICAL VALUE ALERT  Critical value received:  Potassium   Date of notification:  06/15/11  Time of notification:  0605  Critical value read back:yes  Nurse who received alert:  Macarthur Critchley  MD notified (1st page):  hospitalist  Time of first page:  0608  MD notified (2nd page):hospitalist  Time of second page:  Responding MD:  none  Time MD responded:  0630

## 2011-06-18 NOTE — ED Notes (Signed)
Patient asking for pain medication repeatedly. Idol PA aware. Waiting on Ct results

## 2011-06-18 NOTE — Progress Notes (Signed)
Patient ID: Brittany Dudley, female   DOB: [birth year], 46 y.o.   MRN: [id number] Subjective:  I hurt all over.   My bowel movement are clear yellow water with fur on them.  Its the strangest thing i've ever seen.  Per RN BRB on pad.  From rectum per patient.    Objective: Weight change:   Intake/Output Summary (Last 24 hours) at 06/18/11 0717 Last data filed at 06/18/11 0344  Gross per 24 hour  Intake      0 ml  Output    150 ml  Net   -150 ml   Blood pressure 142/82, pulse 99, temperature 99.6 F (37.6 C), temperature source Oral, resp. rate 24, height 5\' 9"  (1.753 m), weight 71.2 kg (156 lb 15.5 oz), last menstrual period 05/30/2011, SpO2 100.00%. Filed Vitals:   06/18/11 0245 06/18/11 0254 06/18/11 0342 06/18/11 0611  BP:  137/85 135/86 142/82  Pulse: 81 90 86 99  Temp:  98.2 F (36.8 C) 97.4 F (36.3 C) 99.6 F (37.6 C)  TempSrc:  Axillary Oral Oral  Resp: 24 21 22 24   Height:   5\' 9"  (1.753 m)   Weight:   71.2 kg (156 lb 15.5 oz)   SpO2: 98% 99% 100% 100%    Physical Exam: General: Awake, Alert, No acute distress, unusual affect.  Lungs: Clear to auscultation bilaterally without wheezes or crackles Cardiovascular: Regular rate and rhythm without murmur gallop or rub normal S1 and S2 Abdomen: disproportionately tender to palpation in any area, nondistended, soft, bowel sounds slightly tympanic Extremities: No significant cyanosis, clubbing, or edema bilateral lower extremities  Basic Metabolic Panel:  Lab 06/18/11 4098 06/17/11 1830  NA 131* 130*  K 2.6* 3.4*  CL 96 91*  CO2 22 23  GLUCOSE 167* 125*  BUN 15 17  CREATININE 1.17* 1.26*  CALCIUM 8.5 9.1  MG 1.9 --  PHOS 2.2* --   Liver Function Tests:  Lab 06/18/11 0445 06/17/11 1830  AST 238* 36  ALT 45* 16  ALKPHOS 127* 80  BILITOT 0.7 0.4  PROT 7.0 8.3  ALBUMIN 2.4* 2.8*    Lab 06/17/11 1830  LIPASE 68*  AMYLASE --   CBC:  Lab 06/18/11 0445 06/17/11 1830  WBC 8.2 11.0*  NEUTROABS -- 8.9*  HGB  8.1* 9.6*  HCT 26.1* 30.2*  MCV 61.6* 60.6*  PLT 127* 149*   Coagulation:  Lab 06/17/11 1945  LABPROT 13.3  INR 0.99   Anemia Panel: No results found for this basename: VITAMINB12,FOLATE,FERRITIN,TIBC,IRON,RETICCTPCT in the last 168 hours Urine Drug Screen: Drugs of Abuse     Component Value Date/Time   LABOPIA NONE DETECTED 07/09/2009 1257   COCAINSCRNUR POSITIVE* 07/09/2009 1257   LABBENZ NONE DETECTED 07/09/2009 1257   AMPHETMU NONE DETECTED 07/09/2009 1257   THCU NONE DETECTED 07/09/2009 1257   LABBARB  Value: NONE DETECTED        DRUG SCREEN FOR MEDICAL PURPOSES ONLY.  IF CONFIRMATION IS NEEDED FOR ANY PURPOSE, NOTIFY LAB WITHIN 5 DAYS.        LOWEST DETECTABLE LIMITS FOR URINE DRUG SCREEN Drug Class       Cutoff (ng/mL) Amphetamine      1000 Barbiturate      200 Benzodiazepine   200 Tricyclics       300 Opiates          300 Cocaine          300 THC  50 07/09/2009 1257    Alcohol Level:  Lab 06/18/11 0445  ETH <11    Studies/Results: Scheduled Meds:   . sodium chloride   Intravenous STAT  . acetaminophen  650 mg Oral Once  . ciprofloxacin  400 mg Intravenous Once  . docusate sodium  100 mg Oral BID  . folic acid  1 mg Oral Daily  . iohexol  20 mL Oral Q1 Hr x 2  . LORazepam  0-4 mg Intravenous Q6H   Followed by  . LORazepam  0-4 mg Intravenous Q12H  . LORazepam  0.5 mg Intravenous Once  . LORazepam  1 mg Intravenous Once  . morphine  4 mg Intravenous Once  . morphine  4 mg Intravenous Once  .  morphine injection  4 mg Intravenous Once  . mulitivitamin with minerals  1 tablet Oral Daily  . ondansetron  4 mg Intravenous Once  . piperacillin-tazobactam (ZOSYN)  IV  3.375 g Intravenous Q8H  . potassium chloride  10 mEq Intravenous Q1 Hr x 4  . potassium chloride  40 mEq Oral Once  . general admission iv infusion   Intravenous Once  . sodium chloride  1,000 mL Intravenous Once  . sodium chloride  1,000 mL Intravenous Once  . thiamine  100 mg Oral Daily    Or  . thiamine  100 mg Intravenous Daily   Continuous Infusions:   . dextrose 5 % and 0.9 % NaCl with KCl 20 mEq/L     PRN Meds:.acetaminophen, acetaminophen, albuterol, alum & mag hydroxide-simeth, iohexol, LORazepam, LORazepam, morphine, ondansetron (ZOFRAN) IV, ondansetron, DISCONTD: ondansetron (ZOFRAN) IV  Anti-infectives:  Anti-infectives     Start     Dose/Rate Route Frequency Ordered Stop   06/18/11 0430  piperacillin-tazobactam (ZOSYN) IVPB 3.375 g       3.375 g 12.5 mL/hr over 240 Minutes Intravenous 3 times per day 06/18/11 0346     06/18/11 0115   ciprofloxacin (CIPRO) IVPB 400 mg        400 mg 200 mL/hr over 60 Minutes Intravenous  Once 06/18/11 0107 06/18/11 0217          Assessment/Plan: Principal Problem:  *Acute pyelonephritis Active Problems:  ARF (acute renal failure)  Hypokalemia  Alcoholism /alcohol abuse  Tobacco abuse    LOS: 1 day   Acute Pyelonephritis on CT: patient urinating.  Zosyn and Cipro started at admission. culture pending.  Will continue gentle hydration.   Ileus. Potassium being repleted.  Will continue to monitor and check follow up abd xray Thurs.  Hypokalemia: from NV. Being repleted IV and orally  Hypophosphatemia:  Being repleted.   ARF: improving with hydration.   Alcoholism:  Very unusual affect.  Appears likely to have withdraw symptoms.  CIWA protocol.  On folate and Thiamine  Tobacco abuse: Nicotine patch  Cocaine abuse:  Counseling.  Social work consulted to offer resources.  Elevated LFTs:  Pattern appears to be alcohol related.  Will monitor.  Check PT/PTT  Microcytic Anemia (MCV 60s).  Guiac stools.  DVT Prophylaxis SCDs   Stephani Police 06/18/2011, 7:17 AM [phone]

## 2011-06-18 NOTE — Progress Notes (Signed)
   CARE MANAGEMENT NOTE 06/18/2011  Patient:  Brittany Dudley, Brittany Dudley   Account Number:  [id number]  Date Initiated:  06/18/2011  Documentation initiated by:  Donn Pierini  Subjective/Objective Assessment:   Pt admitted with acute pyelonephritis     Action/Plan:   PTA pt lived at home with spouse, was independent with ADLs   Anticipated DC Date:  06/20/2011   Anticipated DC Plan:  HOME/SELF CARE      DC Planning Services  CM consult      Choice offered to / List presented to:             Status of service:  In process, will continue to follow Medicare Important Message given?   (If response is "NO", the following Medicare IM given date fields will be blank) Date Medicare IM given:   Date Additional Medicare IM given:    Discharge Disposition:    Per UR Regulation:    If discussed at Long Length of Stay Meetings, dates discussed:    Comments:  PCP- pt not sure (does not have Medicaid card with her)  06/18/11- 1550- Donn Pierini RN, BSN [phone] Spoke with pt regarding potential d/c needs per conversation pt has current Medicaid card and uses Medicaid benefits for medications. She does not know how her listed PCP is on her card. Called [facility] to see if perhaps they may have pt in system- they do not. Pt states that she will need assistance with transportation at discharge- will consult CSW for bus pass. CM to follow for potential d/c needs.

## 2011-06-18 NOTE — H&P (Signed)
Brittany Dudley is an 46 y.o. female.   Chief Complaint: Abdominal Pain HPI: 46 yo with history of alcohol and Tobacco abuse here with abdominal pain, NV. Has had fever and chills with some flank pain. No melena, no Diarrhea, no BRBPR. Was seen in [place] and evaluated. CT abdomen suggest acute Pyelonephritis.  She is also restless and having symptoms of alcohol withdrawal.  Past Medical History  Diagnosis Date  . Hypertension     Past Surgical History  Procedure Date  . Cholecystectomy     History reviewed. No pertinent family history. Social History:  reports that she has been smoking Cigarettes.  She has been smoking about 2 packs per day. She does not have any smokeless tobacco history on file. She reports that she drinks alcohol. She reports that she does not use illicit drugs.  Allergies: No Known Allergies  Medications Prior to Admission  Medication Dose Route Frequency Provider Last Rate Last Dose  . acetaminophen (TYLENOL) tablet 650 mg  650 mg Oral Once Candis Musa, PA   650 mg at 06/17/11 2021  . ciprofloxacin (CIPRO) IVPB 400 mg  400 mg Intravenous Once Candis Musa, PA   400 mg at 06/18/11 0117  . iohexol (OMNIPAQUE) 300 MG/ML solution 100 mL  100 mL Intravenous Once PRN Medication Radiologist, MD   100 mL at 06/18/11 0017  . iohexol (OMNIPAQUE) 300 MG/ML solution 20 mL  20 mL Oral Q1 Hr x 2 Medication Radiologist, MD   20 mL at 06/18/11 0000  . LORazepam (ATIVAN) injection 0.5 mg  0.5 mg Intravenous Once Candis Musa, PA   0.5 mg at 06/18/11 0134  . LORazepam (ATIVAN) injection 1 mg  1 mg Intravenous Once Candis Musa, PA   1 mg at 06/17/11 1851  . morphine 4 MG/ML injection 4 mg  4 mg Intravenous Once Candis Musa, PA   4 mg at 06/17/11 1948  . morphine 4 MG/ML injection 4 mg  4 mg Intravenous Once Candis Musa, PA   4 mg at 06/17/11 2204  . morphine 4 MG/ML injection 4 mg  4 mg Intravenous Once Candis Musa, PA   4 mg at 06/18/11 0134  . ondansetron (ZOFRAN) injection 4 mg  4 mg  Intravenous Once Candis Musa, PA   4 mg at 06/17/11 1851  . sodium chloride 0.9 % bolus 1,000 mL  1,000 mL Intravenous Once Candis Musa, PA   1,000 mL at 06/17/11 1850  . sodium chloride 0.9 % bolus 1,000 mL  1,000 mL Intravenous Once Candis Musa, PA   1,000 mL at 06/17/11 2142   No current outpatient prescriptions on file as of 06/17/2011.    Results for orders placed during the hospital encounter of 06/17/11 (from the past 48 hour(s))  CBC     Status: Abnormal   Collection Time   06/17/11  6:30 PM      Component Value Range Comment   WBC 11.0 (*) 4.0 - 10.5 (K/uL)    RBC 4.98  3.87 - 5.11 (MIL/uL)    Hemoglobin 9.6 (*) 12.0 - 15.0 (g/dL)    HCT 21.3 (*) 08.6 - 46.0 (%)    MCV 60.6 (*) 78.0 - 100.0 (fL)    MCH 19.3 (*) 26.0 - 34.0 (pg)    MCHC 31.8  30.0 - 36.0 (g/dL)    RDW 57.8 (*) 46.9 - 15.5 (%)    Platelets 149 (*) 150 - 400 (K/uL)  DIFFERENTIAL     Status: Abnormal   Collection Time   06/17/11  6:30 PM      Component Value Range Comment   Neutrophils Relative 81 (*) 43 - 77 (%)    Lymphocytes Relative 9 (*) 12 - 46 (%)    Monocytes Relative 10  3 - 12 (%)    Eosinophils Relative 0  0 - 5 (%)    Basophils Relative 0  0 - 1 (%)    Neutro Abs 8.9 (*) 1.7 - 7.7 (K/uL)    Lymphs Abs 1.0  0.7 - 4.0 (K/uL)    Monocytes Absolute 1.1 (*) 0.1 - 1.0 (K/uL)    Eosinophils Absolute 0.0  0.0 - 0.7 (K/uL)    Basophils Absolute 0.0  0.0 - 0.1 (K/uL)    RBC Morphology TARGET CELLS   POLYCHROMASIA PRESENT   WBC Morphology TOXIC GRANULATION   VACUOLATED NEUTROPHILS  COMPREHENSIVE METABOLIC PANEL     Status: Abnormal   Collection Time   06/17/11  6:30 PM      Component Value Range Comment   Sodium 130 (*) 135 - 145 (mEq/L)    Potassium 3.4 (*) 3.5 - 5.1 (mEq/L)    Chloride 91 (*) 96 - 112 (mEq/L)    CO2 23  19 - 32 (mEq/L)    Glucose, Bld 125 (*) 70 - 99 (mg/dL)    BUN 17  6 - 23 (mg/dL)    Creatinine, Ser 1.61 (*) 0.50 - 1.10 (mg/dL)    Calcium 9.1  8.4 - 10.5 (mg/dL)    Total  Protein 8.3  6.0 - 8.3 (g/dL)    Albumin 2.8 (*) 3.5 - 5.2 (g/dL)    AST 36  0 - 37 (U/L) SLIGHT HEMOLYSIS   ALT 16  0 - 35 (U/L)    Alkaline Phosphatase 80  39 - 117 (U/L)    Total Bilirubin 0.4  0.3 - 1.2 (mg/dL)    GFR calc non Af Amer 51 (*) >90 (mL/min)    GFR calc Af Amer 59 (*) >90 (mL/min)   LIPASE, BLOOD     Status: Abnormal   Collection Time   06/17/11  6:30 PM      Component Value Range Comment   Lipase 68 (*) 11 - 59 (U/L)   PROTIME-INR     Status: Normal   Collection Time   06/17/11  7:45 PM      Component Value Range Comment   Prothrombin Time 13.3  11.6 - 15.2 (seconds)    INR 0.99  0.00 - 1.49    URINALYSIS, ROUTINE W REFLEX MICROSCOPIC     Status: Abnormal   Collection Time   06/17/11  8:49 PM      Component Value Range Comment   Color, Urine YELLOW  YELLOW     APPearance CLOUDY (*) CLEAR     Specific Gravity, Urine 1.011  1.005 - 1.030     pH 6.0  5.0 - 8.0     Glucose, UA NEGATIVE  NEGATIVE (mg/dL)    Hgb urine dipstick MODERATE (*) NEGATIVE     Bilirubin Urine NEGATIVE  NEGATIVE     Ketones, ur NEGATIVE  NEGATIVE (mg/dL)    Protein, ur 096 (*) NEGATIVE (mg/dL)    Urobilinogen, UA 0.2  0.0 - 1.0 (mg/dL)    Nitrite NEGATIVE  NEGATIVE     Leukocytes, UA SMALL (*) NEGATIVE    PREGNANCY, URINE     Status: Normal   Collection Time  06/17/11  8:49 PM      Component Value Range Comment   Preg Test, Ur NEGATIVE  NEGATIVE    URINE MICROSCOPIC-ADD ON     Status: Normal   Collection Time   06/17/11  8:49 PM      Component Value Range Comment   Squamous Epithelial / LPF RARE  RARE     WBC, UA 7-10  <3 (WBC/hpf)    RBC / HPF 3-6  <3 (RBC/hpf)    Bacteria, UA RARE  RARE     Ct Abdomen Pelvis W Contrast  06/18/2011  *RADIOLOGY REPORT*  Clinical Data: Bilateral mid to lower abdominal pain, nausea and vomiting.  CT ABDOMEN AND PELVIS WITH CONTRAST  Technique:  Multidetector CT imaging of the abdomen and pelvis was performed following the standard protocol during bolus  administration of intravenous contrast.  Contrast:  100 mL of Omnipaque 300 IV contrast  Comparison: Abdominal radiograph performed earlier today at 07:29 p.m.  Findings: Minimal bibasilar atelectasis is noted.  Minimal prominence of the intrahepatic biliary ducts remains within normal limits status post cholecystectomy.  The liver is otherwise unremarkable in appearance.  The spleen is unremarkable.  Clips are noted at the gallbladder fossa.  The pancreas and adrenal glands are unremarkable.  There is diffuse heterogeneity and decreased enhancement of both kidneys, compatible with bilateral pyelonephritis.  No focal masses are identified.  Minimal associated perinephric stranding is noted. There is no evidence of hydronephrosis.  No renal or ureteral stones are identified.  No free fluid is identified.  The small bowel is unremarkable in appearance.  The stomach is within normal limits.  No acute vascular abnormalities are seen.  The appendix is normal in caliber and contains minimal air, without evidence for appendicitis.  The colon is grossly unremarkable in appearance.  The bladder is mildly distended, though displaced by the patient's enlarged fibroid uterus.  A significantly enlarged uterus is noted, largely filling the pelvis and extending to the level of the umbilicus, with multiple prominent heterogeneous and partially calcified fibroids.  No inguinal lymphadenopathy is seen.  No acute osseous abnormalities are identified.  Vacuum phenomenon and disc space narrowing are noted at L5-S1, with associated endplate irregularity and sclerosis.  IMPRESSION:  1.  Diffuse acute bilateral pyelonephritis noted. 2.  Significantly enlarged fibroid uterus largely fills the pelvis and extends to the level of the umbilicus. 3.  Mild degenerative change at L5-S1.  Original Report Authenticated By: Tonia Ghent, M.D.   Dg Abd Acute W/chest  06/17/2011  *RADIOLOGY REPORT*  Clinical Data: Abdominal pain  ACUTE ABDOMEN  SERIES (ABDOMEN 2 VIEW & CHEST 1 VIEW)  Comparison: None  Findings: Heart size is normal.  No pleural effusion or pulmonary edema.  No airspace consolidation.  Bowel gas pattern is nonspecific.  There are several dilated loops of central small bowel which measure up to 2.9 cm.  There are multiple fluid levels identified within the left abdomen.  There is a paucity of colonic gas.  IMPRESSION:  1.  Mildly dilated loops of small bowel and air-fluid levels. Cannot rule out low grade partial obstruction or small bowel ileus. 2.  No active cardiopulmonary abnormalities.  Original Report Authenticated By: Rosealee Albee, M.D.    Review of Systems  Constitutional: Positive for fever, chills, malaise/fatigue and diaphoresis.  Gastrointestinal: Positive for nausea, vomiting and abdominal pain. Negative for diarrhea, constipation, blood in stool and melena.  Genitourinary: Positive for dysuria, urgency, frequency and flank pain. Negative for hematuria.  All other systems reviewed and are negative.    Blood pressure 129/82, pulse 78, temperature 100.3 F (37.9 C), temperature source Axillary, resp. rate 24, height 5\' 9"  (1.753 m), weight 81.647 kg (180 lb), last menstrual period 05/30/2011, SpO2 99.00%. Physical Exam  Constitutional: She is oriented to person, place, and time. She appears well-developed and well-nourished.  HENT:  Head: Normocephalic and atraumatic.  Right Ear: External ear normal.  Left Ear: External ear normal.  Nose: Nose normal.  Mouth/Throat: Oropharynx is clear and moist.  Eyes: Conjunctivae and EOM are normal. Pupils are equal, round, and reactive to light.  Neck: Normal range of motion. Neck supple.  Cardiovascular: Normal rate, regular rhythm, normal heart sounds and intact distal pulses.   Respiratory: Effort normal and breath sounds normal.  GI: Soft. Bowel sounds are normal.  Musculoskeletal: Normal range of motion.  Neurological: She is alert and oriented to person,  place, and time. She has normal reflexes.  Skin: Skin is warm and dry.  Psychiatric: She has a normal mood and affect. Her behavior is normal. Judgment and thought content normal.     Assessment/Plan 1. Pyelonephritis: Admit, IV Abx, get culture and sensitivities, hydrate 2. Hypokalemia: from NV. Replete 3. ARF: Hydrate. Likely from dehydration 4. Alcoholism: Some early withdrawal symptoms. Start CIWA protocol. 5. Tobacco abuse: Counseling and Nicotine patch if accepted  Aryana Wonnacott,LAWAL 06/18/2011, 2:10 AM

## 2011-06-18 NOTE — Progress Notes (Signed)
PHARMACY - ANTIBIOTIC CONSULT  INITIAL NOTE  Pharmacy Consult for: Zosyn  Indication: R/o UTI   Patient Data:   Allergies: No Known Allergies  Patient Measurements: Height: 5\' 9"  (175.3 cm) Weight: 180 lb (81.647 kg) IBW/kg (Calculated) : 66.2    Vital Signs: Temp:  [98.2 F (36.8 C)-100.3 F (37.9 C)] 98.2 F (36.8 C) (03/26 0254) Pulse Rate:  [76-118] 90  (03/26 0254) Resp:  [19-30] 21  (03/26 0254) BP: (104-152)/(70-99) 137/85 mmHg (03/26 0254) SpO2:  [96 %-100 %] 99 % (03/26 0254) Weight:  [180 lb (81.647 kg)] 180 lb (81.647 kg) (03/25 1948)  Intake/Output from previous day: No intake or output data in the 24 hours ending 06/18/11 0343  Labs:  [facility] 06/17/11 1830  WBC 11.0*  HGB 9.6*  PLT 149*  LABCREA --  CREATININE 1.26*   Estimated Creatinine Clearance: 65.1 ml/min (by C-G formula based on Cr of 1.26). No results found for this basename: VANCOTROUGH:2,VANCOPEAK:2,VANCORANDOM:2,GENTTROUGH:2,GENTPEAK:2,GENTRANDOM:2,TOBRATROUGH:2,TOBRAPEAK:2,TOBRARND:2,AMIKACINPEAK:2,AMIKACINTROU:2,AMIKACIN:2, in the last 72 hours   Microbiology: No results found for this or any previous visit (from the past 720 hour(s)).  Medical History: Past Medical History  Diagnosis Date  . Hypertension     Scheduled Medications:     . sodium chloride   Intravenous STAT  . acetaminophen  650 mg Oral Once  . ciprofloxacin  400 mg Intravenous Once  . docusate sodium  100 mg Oral BID  . folic acid  1 mg Oral Daily  . iohexol  20 mL Oral Q1 Hr x 2  . LORazepam  0-4 mg Intravenous Q6H   Followed by  . LORazepam  0-4 mg Intravenous Q12H  . LORazepam  0.5 mg Intravenous Once  . LORazepam  1 mg Intravenous Once  . morphine  4 mg Intravenous Once  . morphine  4 mg Intravenous Once  .  morphine injection  4 mg Intravenous Once  . mulitivitamin with minerals  1 tablet Oral Daily  . ondansetron  4 mg Intravenous Once  . general admission iv infusion   Intravenous Once  . sodium  chloride  1,000 mL Intravenous Once  . sodium chloride  1,000 mL Intravenous Once  . thiamine  100 mg Oral Daily   Or  . thiamine  100 mg Intravenous Daily      Assessment:  46 y.o. female admitted on 06/17/2011, with r/o UTI. Pharmacy consulted to manage Zosyn.    Plan:  1. Zosyn 3.375gm IV Q8H (4 hr infusion).  2. Follow-up renal function and culture results.    Dineen Kid Thad Ranger, PharmD 06/18/2011, 3:43 AM

## 2011-06-18 NOTE — ED Notes (Signed)
MD at bedside. Idol PA with patient 

## 2011-06-18 NOTE — Progress Notes (Signed)
I have personally seen and examined Ms. Brittany Dudley I agree with PE/AP as per Adalberto Ill, PA note Lonia Blood

## 2011-06-19 LAB — CBC
HCT: 23.7 % — ABNORMAL LOW (ref 36.0–46.0)
Hemoglobin: 7.3 g/dL — ABNORMAL LOW (ref 12.0–15.0)
MCH: 19.1 pg — ABNORMAL LOW (ref 26.0–34.0)
MCHC: 30.8 g/dL (ref 30.0–36.0)
RDW: 21.6 % — ABNORMAL HIGH (ref 11.5–15.5)

## 2011-06-19 LAB — IRON AND TIBC: UIBC: 261 ug/dL (ref 125–400)

## 2011-06-19 LAB — PROTIME-INR: Prothrombin Time: 13.1 seconds (ref 11.6–15.2)

## 2011-06-19 LAB — BASIC METABOLIC PANEL
BUN: 7 mg/dL (ref 6–23)
Creatinine, Ser: 1 mg/dL (ref 0.50–1.10)
GFR calc Af Amer: 78 mL/min — ABNORMAL LOW (ref >90)
GFR calc non Af Amer: 67 mL/min — ABNORMAL LOW (ref >90)
Glucose, Bld: 130 mg/dL — ABNORMAL HIGH (ref 70–99)

## 2011-06-19 LAB — PHOSPHORUS: Phosphorus: 2.7 mg/dL (ref 2.3–4.6)

## 2011-06-19 MED ORDER — SODIUM CHLORIDE 0.9 % IV SOLN
25.0000 mg | Freq: Once | INTRAVENOUS | Status: AC
  Administered 2011-06-19: 25 mg via INTRAVENOUS
  Filled 2011-06-19: qty 0.5

## 2011-06-19 MED ORDER — IRON DEXTRAN 50 MG/ML IJ SOLN
25.0000 mg | Freq: Once | INTRAMUSCULAR | Status: DC
  Filled 2011-06-19 (×2): qty 0.5

## 2011-06-19 MED ORDER — POTASSIUM CHLORIDE CRYS ER 20 MEQ PO TBCR
40.0000 meq | EXTENDED_RELEASE_TABLET | Freq: Once | ORAL | Status: AC
  Administered 2011-06-19: 40 meq via ORAL
  Filled 2011-06-19: qty 2

## 2011-06-19 MED ORDER — SODIUM CHLORIDE 0.9 % IV SOLN
500.0000 mg | Freq: Every day | INTRAVENOUS | Status: AC
  Administered 2011-06-19 – 2011-06-21 (×3): 500 mg via INTRAVENOUS
  Filled 2011-06-19 (×7): qty 10

## 2011-06-19 NOTE — ED Provider Notes (Signed)
Medical screening examination/treatment/procedure(s) were conducted as a shared visit with non-physician practitioner(s) and myself.  I personally evaluated the patient during the encounter 46 year old woman who drinks alcohol heavily, but not for the past 3 days, presents with abdominal pain, seemed intermittently confused to me.  Exam shows mild diffuse abdominal tenderness.  CT suggests acute and chronic pyelonephritis.  Advised admission.   Carleene Cooper III, MD 06/19/11 1001

## 2011-06-19 NOTE — Plan of Care (Signed)
Problem: Phase II Progression Outcomes Goal: Vital signs remain stable, temperature < 100 Outcome: Progressing Patient afebrile and BP elevated

## 2011-06-19 NOTE — Progress Notes (Signed)
PATIENT DETAILS Name: Brittany Dudley Age: 46 y.o. Sex: female Date of Birth: May 03, 1965 Admit Date: 06/17/2011 PCP:No primary provider on file.  Subjective: Back and abdominal pain is better Vomiting resolved Diarrhea slowing down  Objective: Vital signs in last 24 hours: Filed Vitals:   06/19/11 0439 06/19/11 0943 06/19/11 1250 06/19/11 1300  BP: 132/87 149/99 141/91 142/90  Pulse: 91 91 76 76  Temp: 98.4 F (36.9 C)  98.5 F (36.9 C)   TempSrc: Oral  Oral   Resp: 20  22   Height:      Weight:      SpO2: 100%  100%     Weight change:   Body mass index is 23.18 kg/(m^2).  Intake/Output from previous day:  Intake/Output Summary (Last 24 hours) at 06/19/11 1333 Last data filed at 06/19/11 0830  Gross per 24 hour  Intake    840 ml  Output   1201 ml  Net   -361 ml    PHYSICAL EXAM: Gen Exam: Awake and alert with clear speech.  Neck: Supple, No JVD.   Chest: B/L Clear.   CVS: S1 S2 Regular, no murmurs.  Abdomen: soft, BS +, non tender, non distended.  Extremities: no edema, lower extremities warm to touch. Neurologic: Non Focal.   Skin: No Rash.  Wounds: N/A.    CONSULTS:  None  LAB RESULTS: CBC  Lab 06/19/11 0535 06/18/11 0445 06/17/11 1830  WBC 5.6 8.2 11.0*  HGB 7.3* 8.1* 9.6*  HCT 23.7* 26.1* 30.2*  PLT 112* 127* 149*  MCV 61.9* 61.6* 60.6*  MCH 19.1* 19.1* 19.3*  MCHC 30.8 31.0 31.8  RDW 21.6* 21.0* 21.2*  LYMPHSABS -- -- 1.0  MONOABS -- -- 1.1*  EOSABS -- -- 0.0  BASOSABS -- -- 0.0  BANDABS -- -- --    Chemistries   Lab 06/19/11 0535 06/18/11 0445 06/17/11 1830  NA 133* 131* 130*  K 3.4* 2.6* 3.4*  CL 102 96 91*  CO2 20 22 23   GLUCOSE 130* 167* 125*  BUN 7 15 17   CREATININE 1.00 1.17* 1.26*  CALCIUM 8.7 8.5 9.1  MG -- 1.9 --    GFR Estimated Creatinine Clearance: 75 ml/min (by C-G formula based on Cr of 1).  Coagulation profile  Lab 06/19/11 0535 06/17/11 1945  INR 0.97 0.99  PROTIME -- --    Cardiac Enzymes No results  found for this basename: CK:3,CKMB:3,TROPONINI:3,MYOGLOBIN:3 in the last 168 hours  No components found with this basename: POCBNP:3 No results found for this basename: DDIMER:2 in the last 72 hours No results found for this basename: HGBA1C:2 in the last 72 hours No results found for this basename: CHOL:2,HDL:2,LDLCALC:2,TRIG:2,CHOLHDL:2,LDLDIRECT:2 in the last 72 hours No results found for this basename: TSH,T4TOTAL,FREET3,T3FREE,THYROIDAB in the last 72 hours  Basename 06/18/11 1720  VITAMINB12 553  FOLATE 19.4  FERRITIN 79  TIBC Not calculated due to Iron <10.  IRON <10*  RETICCTPCT <0.4*    Basename 06/17/11 1830  LIPASE 68*  AMYLASE --    Urine Studies No results found for this basename: UACOL:2,UAPR:2,USPG:2,UPH:2,UTP:2,UGL:2,UKET:2,UBIL:2,UHGB:2,UNIT:2,UROB:2,ULEU:2,UEPI:2,UWBC:2,URBC:2,UBAC:2,CAST:2,CRYS:2,UCOM:2,BILUA:2 in the last 72 hours  MICROBIOLOGY: No results found for this or any previous visit (from the past 240 hour(s)).  RADIOLOGY STUDIES/RESULTS: Ct Abdomen Pelvis W Contrast  06/18/2011  *RADIOLOGY REPORT*  Clinical Data: Bilateral mid to lower abdominal pain, nausea and vomiting.  CT ABDOMEN AND PELVIS WITH CONTRAST  Technique:  Multidetector CT imaging of the abdomen and pelvis was performed following the standard protocol during bolus administration of  intravenous contrast.  Contrast:  100 mL of Omnipaque 300 IV contrast  Comparison: Abdominal radiograph performed earlier today at 07:29 p.m.  Findings: Minimal bibasilar atelectasis is noted.  Minimal prominence of the intrahepatic biliary ducts remains within normal limits status post cholecystectomy.  The liver is otherwise unremarkable in appearance.  The spleen is unremarkable.  Clips are noted at the gallbladder fossa.  The pancreas and adrenal glands are unremarkable.  There is diffuse heterogeneity and decreased enhancement of both kidneys, compatible with bilateral pyelonephritis.  No focal masses are  identified.  Minimal associated perinephric stranding is noted. There is no evidence of hydronephrosis.  No renal or ureteral stones are identified.  No free fluid is identified.  The small bowel is unremarkable in appearance.  The stomach is within normal limits.  No acute vascular abnormalities are seen.  The appendix is normal in caliber and contains minimal air, without evidence for appendicitis.  The colon is grossly unremarkable in appearance.  The bladder is mildly distended, though displaced by the patient's enlarged fibroid uterus.  A significantly enlarged uterus is noted, largely filling the pelvis and extending to the level of the umbilicus, with multiple prominent heterogeneous and partially calcified fibroids.  No inguinal lymphadenopathy is seen.  No acute osseous abnormalities are identified.  Vacuum phenomenon and disc space narrowing are noted at L5-S1, with associated endplate irregularity and sclerosis.  IMPRESSION:  1.  Diffuse acute bilateral pyelonephritis noted. 2.  Significantly enlarged fibroid uterus largely fills the pelvis and extends to the level of the umbilicus. 3.  Mild degenerative change at L5-S1.  Original Report Authenticated By: Tonia Ghent, M.D.   Dg Abd Acute W/chest  06/17/2011  *RADIOLOGY REPORT*  Clinical Data: Abdominal pain  ACUTE ABDOMEN SERIES (ABDOMEN 2 VIEW & CHEST 1 VIEW)  Comparison: None  Findings: Heart size is normal.  No pleural effusion or pulmonary edema.  No airspace consolidation.  Bowel gas pattern is nonspecific.  There are several dilated loops of central small bowel which measure up to 2.9 cm.  There are multiple fluid levels identified within the left abdomen.  There is a paucity of colonic gas.  IMPRESSION:  1.  Mildly dilated loops of small bowel and air-fluid levels. Cannot rule out low grade partial obstruction or small bowel ileus. 2.  No active cardiopulmonary abnormalities.  Original Report Authenticated By: Rosealee Albee, M.D.     MEDICATIONS: Scheduled Meds:   . sodium chloride   Intravenous STAT  . folic acid  1 mg Oral Daily  . iron dextran (INFED/DEXFERRUM) infusion  25 mg Intravenous Once  . iron dextran (INFED/DEXFERRUM) infusion  500 mg Intravenous Daily  . LORazepam  0-4 mg Intravenous Q6H   Followed by  . LORazepam  0-4 mg Intravenous Q12H  . mulitivitamin with minerals  1 tablet Oral Daily  . nicotine  21 mg Transdermal Q24H  . piperacillin-tazobactam (ZOSYN)  IV  3.375 g Intravenous Q8H  . potassium phosphate IVPB (mmol)  20 mmol Intravenous Once  . thiamine  100 mg Oral Daily   Or  . thiamine  100 mg Intravenous Daily  . DISCONTD: docusate sodium  100 mg Oral BID  . DISCONTD: iron dextran complex  25 mg Intravenous Once  . DISCONTD: nicotine  21 mg Transdermal Daily   Continuous Infusions:   . sodium chloride 50 mL/hr at 06/19/11 0941  . DISCONTD: dextrose 5 % and 0.9 % NaCl with KCl 20 mEq/L Stopped (06/18/11 1500)   PRN Meds:.acetaminophen,  acetaminophen, albuterol, alum & mag hydroxide-simeth, LORazepam, LORazepam, morphine, ondansetron (ZOFRAN) IV, ondansetron, DISCONTD: morphine  Antibiotics: Anti-infectives     Start     Dose/Rate Route Frequency Ordered Stop   06/18/11 0430  piperacillin-tazobactam (ZOSYN) IVPB 3.375 g       3.375 g 12.5 mL/hr over 240 Minutes Intravenous 3 times per day 06/18/11 0346     06/18/11 0115   ciprofloxacin (CIPRO) IVPB 400 mg        400 mg 200 mL/hr over 60 Minutes Intravenous  Once 06/18/11 0107 06/18/11 0217          Assessment/Plan: Patient Active Hospital Problem List: Acute pyelonephritis -likely causing her back pain-but overall better than admission -await urine c/s results, continue with empiric Zosyn till then  ARF (acute renal failure)  -resolved, likely pre-renal  Hypokalemia -likely secondary to GI loss and ETOH -better today, will give additional 40 meq dose of Kcl today  Alcoholism /alcohol abuse -awake, and alert-non  tremulous -last drink almost 6 days back -Ativan per CIWAA scale -continue with Thiamine and Folate -have counselled extensively regarding the importance of completely stopping ETOH use  Cocaine use -patient has been counselled extensively  Anemia -Iron deficiency-likely secondary to chronic menorrhagia-has long standing history of anemia-from review of her prior hospitalization in 2011 -per patient she has long standing history of menorrhagia secondary to Fibroids-CT abdomen done on admission does show enlarged uterus upto level of umbilicus -patient claims that she gets frequent IV Iron at [facility] refuses a Blood transfusion-she is a Jehovah's witness -no overt evidence of a GI bleed. -Will give IV Iron today  Tobacco abuse  -Continue with Nicotine patch  Disposition: Remain inpatient-but begin to mobilize  DVT Prophylaxis: SCD's-will avoid Lovenox worsening anemia  Code Status: Full Code  Maretta Bees,   MD. 06/19/2011, 1:33 PM

## 2011-06-19 NOTE — Progress Notes (Signed)
Patient with elevated blood pressure. Md notified, plans to continue to monitor.

## 2011-06-19 NOTE — Progress Notes (Signed)
Pt has some bloody discharge with bowel movements. Dr. Is aware. Will continue to monitor.

## 2011-06-20 ENCOUNTER — Inpatient Hospital Stay (HOSPITAL_COMMUNITY): Payer: Medicaid Other

## 2011-06-20 LAB — COMPREHENSIVE METABOLIC PANEL
CO2: 23 mEq/L (ref 19–32)
Calcium: 8.8 mg/dL (ref 8.4–10.5)
Creatinine, Ser: 0.95 mg/dL (ref 0.50–1.10)
GFR calc Af Amer: 83 mL/min — ABNORMAL LOW (ref >90)
GFR calc non Af Amer: 72 mL/min — ABNORMAL LOW (ref >90)
Glucose, Bld: 89 mg/dL (ref 70–99)
Sodium: 136 mEq/L (ref 135–145)
Total Protein: 6.2 g/dL (ref 6.0–8.3)

## 2011-06-20 LAB — CBC
Hemoglobin: 6.9 g/dL — CL (ref 12.0–15.0)
MCH: 19 pg — ABNORMAL LOW (ref 26.0–34.0)
MCHC: 30.8 g/dL (ref 30.0–36.0)
MCV: 61.7 fL — ABNORMAL LOW (ref 78.0–100.0)
RBC: 3.63 MIL/uL — ABNORMAL LOW (ref 3.87–5.11)

## 2011-06-20 LAB — URINE CULTURE

## 2011-06-20 MED ORDER — ENSURE COMPLETE PO LIQD
237.0000 mL | Freq: Three times a day (TID) | ORAL | Status: DC
  Administered 2011-06-20 – 2011-06-21 (×3): 237 mL via ORAL

## 2011-06-20 MED ORDER — CIPROFLOXACIN IN D5W 400 MG/200ML IV SOLN
400.0000 mg | Freq: Two times a day (BID) | INTRAVENOUS | Status: DC
  Administered 2011-06-20 – 2011-06-21 (×3): 400 mg via INTRAVENOUS
  Filled 2011-06-20 (×5): qty 200

## 2011-06-20 MED ORDER — HYDROCODONE-ACETAMINOPHEN 10-325 MG PO TABS
1.0000 | ORAL_TABLET | Freq: Four times a day (QID) | ORAL | Status: DC | PRN
  Administered 2011-06-20 – 2011-06-21 (×4): 2 via ORAL
  Filled 2011-06-20 (×5): qty 2

## 2011-06-20 NOTE — Progress Notes (Signed)
   CARE MANAGEMENT NOTE 06/20/2011  Patient:  Brittany Dudley, Brittany Dudley   Account Number:  [id number]  Date Initiated:  06/18/2011  Documentation initiated by:  Donn Pierini  Subjective/Objective Assessment:   Pt admitted with acute pyelonephritis     Action/Plan:   PTA pt lived at home with spouse, was independent with ADLs   Anticipated DC Date:  06/20/2011   Anticipated DC Plan:  HOME/SELF CARE  In-house referral  Clinical Social Worker      DC Associate Professor  CM consult      [facility] Choice  DURABLE MEDICAL EQUIPMENT   Choice offered to / List presented to:  C-1 Patient   DME arranged  Levan Hurst      DME agency  [facility]        Status of service:  In process, will continue to follow Medicare Important Message given?   (If response is "NO", the following Medicare IM given date fields will be blank) Date Medicare IM given:   Date Additional Medicare IM given:    Discharge Disposition:    Per UR Regulation:    If discussed at Long Length of Stay Meetings, dates discussed:    Comments:  PCP- pt not sure (does not have Medicaid card with her)  06/20/11- 1420- Donn Pierini RN, BSN [phone] Per MD request call made to [facility] outpt clinic to arrange f/u outpt appointment- appointment set for May 2 at 3pm at the clinic. Order also for RW- spoke with pt at bedside- informed her about output f/u appoinment with the clinic for GYN f/u. Choice also offered for DME- pt wants to use [facility] for DME- referral called to [facility] for RW- message left for [place]. RW to be delivered to room prior to discharge. Plan if for pt to d/c tomorrow.  06/18/11- 1550- Donn Pierini RN, BSN [phone] Spoke with pt regarding potential d/c needs per conversation pt has current Medicaid card and uses Medicaid benefits for medications. She does not know how her listed PCP is on her card. Called [facility] to see if perhaps they may have pt in system- they do not. Pt states that she  will need assistance with transportation at discharge- will consult [facility] for bus pass. CM to follow for potential d/c needs.

## 2011-06-20 NOTE — Progress Notes (Signed)
CRITICAL VALUE ALERT  Critical value received:  hgb 6.9  Date of notification:  06/20/11  Time of notification:  722  Critical value read back:yes  Nurse who received alert:  Susann Givens   MD notified (1st page):  Ghimire  Time of first page:  734  MD notified (2nd page):  Time of second page:  Responding MD:  ghimire  Time MD responded:  750

## 2011-06-20 NOTE — Evaluation (Signed)
Physical Therapy Evaluation Patient Details Name: Brittany Dudley MRN: [id number] DOB: 11-04-1965 Today's Date: 06/20/2011  Problem List:  Patient Active Problem List  Diagnoses  . Acute pyelonephritis  . ARF (acute renal failure)  . Hypokalemia  . Alcoholism /alcohol abuse  . Tobacco abuse    Past Medical History:  Past Medical History  Diagnosis Date  . Hypertension    Past Surgical History:  Past Surgical History  Procedure Date  . Cholecystectomy     PT Assessment/Plan/Recommendation PT Assessment Clinical Impression Statement: Pt adm with pyelonephritis.  Pt also with history of ETOH abuse and anemia.  Pt slightly unsteady on feet and did better with RW. PT Recommendation/Assessment: Patient will need skilled PT in the acute care venue PT Problem List: Decreased balance;Decreased mobility;Decreased knowledge of use of DME PT Therapy Diagnosis : Difficulty walking PT Plan PT Frequency: Min 3X/week PT Treatment/Interventions: DME instruction;Gait training;Functional mobility training;Therapeutic activities;Therapeutic exercise;Balance training;Patient/family education PT Recommendation Follow Up Recommendations: No PT follow up Equipment Recommended: Rolling walker with 5" wheels PT Goals  Acute Rehab PT Goals PT Goal Formulation: With patient Time For Goal Achievement: 7 days Pt will Ambulate: >150 feet;with modified independence;with least restrictive assistive device PT Goal: Ambulate - Progress: Goal set today  PT Evaluation Precautions/Restrictions  Precautions Precautions: Fall Prior Functioning  Home Living Lives With: Significant other Home Layout: Two level;1/2 bath on main level;Bed/bath upstairs Alternate Level Stairs-Rails: Right Alternate Level Stairs-Number of Steps: 1 flight Home Access: Stairs to enter Entrance Stairs-Rails: None Entrance Stairs-Number of Steps: 2 Home Adaptive Equipment: None Prior Function Level of Independence: Independent  with basic ADLs;Independent with transfers;Independent with homemaking with ambulation;Independent with gait Cognition Cognition Arousal/Alertness: Awake/alert Overall Cognitive Status: Impaired Orientation Level: Oriented X4 Safety/Judgement: Decreased awareness of safety precautions Decreased Safety/Judgement: Decreased awareness of need for assistance Sensation/Coordination   Extremity Assessment RLE Assessment RLE Assessment: Within Functional Limits LLE Assessment LLE Assessment: Within Functional Limits Mobility (including Balance) Bed Mobility Bed Mobility: Yes Supine to Sit: 7: Independent;HOB flat Sitting - Scoot to Edge of Bed: 7: Independent Transfers Transfers: Yes Sit to Stand: 7: Independent;From bed;From chair/3-in-1;With upper extremity assist;With armrests Stand to Sit: 5: Supervision;To chair/3-in-1;To bed Ambulation/Gait Ambulation/Gait: Yes Ambulation/Gait Assistance: 5: Supervision;4: Min assist Ambulation/Gait Assistance Details (indicate cue type and reason): min A without walker and supervision when using rolling walker. Ambulation Distance (Feet): 200 Feet Assistive device: Rolling walker;None Gait Pattern:  (slightly unsteady)  Static Standing Balance Static Standing - Balance Support: No upper extremity supported Static Standing - Level of Assistance: 5: Stand by assistance Dynamic Standing Balance Dynamic Standing - Balance Support: No upper extremity supported Dynamic Standing - Level of Assistance: 4: Min assist Exercise    End of Session PT - End of Session Activity Tolerance: Patient tolerated treatment well Patient left: in bed;with call bell in reach;with bed alarm set Nurse Communication: Mobility status for ambulation General Behavior During Session: [facility] for tasks performed Cognition: [facility] for tasks performed  [facility] 06/20/2011, 1:00 PM  [employer] PT [phone]

## 2011-06-20 NOTE — Progress Notes (Signed)
PATIENT DETAILS Name: Brittany Dudley Age: 46 y.o. Sex: female Date of Birth: 01-19-1966 Admit Date: 06/17/2011 PCP:No primary provider on file.  Subjective: Continues to have back and abdominal pain Vomiting and diarrhea better  Objective: Vital signs in last 24 hours: Filed Vitals:   06/19/11 1343 06/19/11 1600 06/19/11 2120 06/20/11 0520  BP: 154/101 145/90 153/104 157/103  Pulse: 84 81 93 78  Temp: 98.1 F (36.7 C)  97.8 F (36.6 C) 97.2 F (36.2 C)  TempSrc: Oral  Oral Oral  Resp: 18  16 16   Height:      Weight:      SpO2: 100%  100% 100%    Weight change:   Body mass index is 23.18 kg/(m^2).  Intake/Output from previous day:  Intake/Output Summary (Last 24 hours) at 06/20/11 1151 Last data filed at 06/20/11 0500  Gross per 24 hour  Intake 2163.83 ml  Output   2200 ml  Net -36.17 ml    PHYSICAL EXAM: Gen Exam: Awake and alert with clear speech.  Neck: Supple, No JVD.   Chest: B/L Clear.   CVS: S1 S2 Regular, no murmurs.  Abdomen: soft, BS +,mild  Tenderness in lower abdomen, non distended. No rebound Extremities: no edema, lower extremities warm to touch. Neurologic: Non Focal.   Skin: No Rash.  Wounds: N/A.    CONSULTS:  None  LAB RESULTS: CBC  Lab 06/20/11 0512 06/19/11 0535 06/18/11 0445 06/17/11 1830  WBC 4.8 5.6 8.2 11.0*  HGB 6.9* 7.3* 8.1* 9.6*  HCT 22.4* 23.7* 26.1* 30.2*  PLT 155 112* 127* 149*  MCV 61.7* 61.9* 61.6* 60.6*  MCH 19.0* 19.1* 19.1* 19.3*  MCHC 30.8 30.8 31.0 31.8  RDW 21.5* 21.6* 21.0* 21.2*  LYMPHSABS -- -- -- 1.0  MONOABS -- -- -- 1.1*  EOSABS -- -- -- 0.0  BASOSABS -- -- -- 0.0  BANDABS -- -- -- --    Chemistries   Lab 06/20/11 0512 06/19/11 0535 06/18/11 0445 06/17/11 1830  NA 136 133* 131* 130*  K 3.9 3.4* 2.6* 3.4*  CL 104 102 96 91*  CO2 23 20 22 23   GLUCOSE 89 130* 167* 125*  BUN 6 7 15 17   CREATININE 0.95 1.00 1.17* 1.26*  CALCIUM 8.8 8.7 8.5 9.1  MG -- -- 1.9 --    GFR Estimated Creatinine  Clearance: 79 ml/min (by C-G formula based on Cr of 0.95).  Coagulation profile  Lab 06/19/11 0535 06/17/11 1945  INR 0.97 0.99  PROTIME -- --    Cardiac Enzymes No results found for this basename: CK:3,CKMB:3,TROPONINI:3,MYOGLOBIN:3 in the last 168 hours  No components found with this basename: POCBNP:3 No results found for this basename: DDIMER:2 in the last 72 hours No results found for this basename: HGBA1C:2 in the last 72 hours No results found for this basename: CHOL:2,HDL:2,LDLCALC:2,TRIG:2,CHOLHDL:2,LDLDIRECT:2 in the last 72 hours No results found for this basename: TSH,T4TOTAL,FREET3,T3FREE,THYROIDAB in the last 72 hours  Basename 06/18/11 1720  VITAMINB12 553  FOLATE 19.4  FERRITIN 79  TIBC Not calculated due to Iron <10.  IRON <10*  RETICCTPCT <0.4*    Basename 06/17/11 1830  LIPASE 68*  AMYLASE --    Urine Studies No results found for this basename: UACOL:2,UAPR:2,USPG:2,UPH:2,UTP:2,UGL:2,UKET:2,UBIL:2,UHGB:2,UNIT:2,UROB:2,ULEU:2,UEPI:2,UWBC:2,URBC:2,UBAC:2,CAST:2,CRYS:2,UCOM:2,BILUA:2 in the last 72 hours  MICROBIOLOGY: Recent Results (from the past 240 hour(s))  URINE CULTURE     Status: Normal   Collection Time   06/17/11  8:49 PM      Component Value Range Status Comment   Specimen  Description URINE, RANDOM   Final    Special Requests NONE   Final    Culture  Setup Time [id number]   Final    Colony Count >=100,000 COLONIES/ML   Final    Culture ESCHERICHIA COLI   Final    Report Status 06/20/2011 FINAL   Final    Organism ID, Bacteria ESCHERICHIA COLI   Final     RADIOLOGY STUDIES/RESULTS: Ct Abdomen Pelvis W Contrast  06/18/2011  *RADIOLOGY REPORT*  Clinical Data: Bilateral mid to lower abdominal pain, nausea and vomiting.  CT ABDOMEN AND PELVIS WITH CONTRAST  Technique:  Multidetector CT imaging of the abdomen and pelvis was performed following the standard protocol during bolus administration of intravenous contrast.  Contrast:  100 mL of  Omnipaque 300 IV contrast  Comparison: Abdominal radiograph performed earlier today at 07:29 p.m.  Findings: Minimal bibasilar atelectasis is noted.  Minimal prominence of the intrahepatic biliary ducts remains within normal limits status post cholecystectomy.  The liver is otherwise unremarkable in appearance.  The spleen is unremarkable.  Clips are noted at the gallbladder fossa.  The pancreas and adrenal glands are unremarkable.  There is diffuse heterogeneity and decreased enhancement of both kidneys, compatible with bilateral pyelonephritis.  No focal masses are identified.  Minimal associated perinephric stranding is noted. There is no evidence of hydronephrosis.  No renal or ureteral stones are identified.  No free fluid is identified.  The small bowel is unremarkable in appearance.  The stomach is within normal limits.  No acute vascular abnormalities are seen.  The appendix is normal in caliber and contains minimal air, without evidence for appendicitis.  The colon is grossly unremarkable in appearance.  The bladder is mildly distended, though displaced by the patient's enlarged fibroid uterus.  A significantly enlarged uterus is noted, largely filling the pelvis and extending to the level of the umbilicus, with multiple prominent heterogeneous and partially calcified fibroids.  No inguinal lymphadenopathy is seen.  No acute osseous abnormalities are identified.  Vacuum phenomenon and disc space narrowing are noted at L5-S1, with associated endplate irregularity and sclerosis.  IMPRESSION:  1.  Diffuse acute bilateral pyelonephritis noted. 2.  Significantly enlarged fibroid uterus largely fills the pelvis and extends to the level of the umbilicus. 3.  Mild degenerative change at L5-S1.  Original Report Authenticated By: Tonia Ghent, M.D.   Dg Abd Acute W/chest  06/17/2011  *RADIOLOGY REPORT*  Clinical Data: Abdominal pain  ACUTE ABDOMEN SERIES (ABDOMEN 2 VIEW & CHEST 1 VIEW)  Comparison: None   Findings: Heart size is normal.  No pleural effusion or pulmonary edema.  No airspace consolidation.  Bowel gas pattern is nonspecific.  There are several dilated loops of central small bowel which measure up to 2.9 cm.  There are multiple fluid levels identified within the left abdomen.  There is a paucity of colonic gas.  IMPRESSION:  1.  Mildly dilated loops of small bowel and air-fluid levels. Cannot rule out low grade partial obstruction or small bowel ileus. 2.  No active cardiopulmonary abnormalities.  Original Report Authenticated By: Rosealee Albee, M.D.    MEDICATIONS: Scheduled Meds:    . feeding supplement  237 mL Oral TID WC  . folic acid  1 mg Oral Daily  . iron dextran (INFED/DEXFERRUM) infusion  500 mg Intravenous Daily  . LORazepam  0-4 mg Intravenous Q6H   Followed by  . LORazepam  0-4 mg Intravenous Q12H  . mulitivitamin with minerals  1 tablet Oral Daily  .  nicotine  21 mg Transdermal Q24H  . piperacillin-tazobactam (ZOSYN)  IV  3.375 g Intravenous Q8H  . potassium chloride  40 mEq Oral Once  . thiamine  100 mg Oral Daily   Or  . thiamine  100 mg Intravenous Daily   Continuous Infusions:    . sodium chloride 40 mL/hr at 06/20/11 1114   PRN Meds:.acetaminophen, acetaminophen, albuterol, alum & mag hydroxide-simeth, HYDROcodone-acetaminophen, LORazepam, LORazepam, morphine, ondansetron (ZOFRAN) IV, ondansetron  Antibiotics: Anti-infectives     Start     Dose/Rate Route Frequency Ordered Stop   06/18/11 0430   piperacillin-tazobactam (ZOSYN) IVPB 3.375 g        3.375 g 12.5 mL/hr over 240 Minutes Intravenous 3 times per day 06/18/11 0346     06/18/11 0115   ciprofloxacin (CIPRO) IVPB 400 mg        400 mg 200 mL/hr over 60 Minutes Intravenous  Once 06/18/11 0107 06/18/11 0217          Assessment/Plan: Patient Active Hospital Problem List: Acute pyelonephritis -likely causing her back pain-but overall better than admission -urine c/s results-E.  Coli-transition to Cipro  ARF (acute renal failure)  -resolved, likely pre-renal  Hypokalemia -likely secondary to GI loss and ETOH -resolved  Alcoholism /alcohol abuse -awake, and alert-non tremulous -last drink almost 6 days back -Ativan per CIWAA scale -continue with Thiamine and Folate -have counselled extensively regarding the importance of completely stopping ETOH use  Cocaine use -patient has been counselled extensively  Anemia -Iron deficiency-likely secondary to chronic menorrhagia-has long standing history of anemia-from review of her prior hospitalization in 2011 -per patient she has long standing history of menorrhagia secondary to Fibroids-CT abdomen done on admission does show enlarged uterus upto level of umbilicus -patient claims that she gets frequent IV Iron at [facility] refuses a Blood transfusion-she is a Jehovah's witness -no overt evidence of a GI bleed. -Will give IV Iron today as well -will try to set up outpatient GYN follow up  Tobacco abuse  -Continue with Nicotine patch  Disposition: Remain inpatient-but begin to mobilize  DVT Prophylaxis: SCD's-will avoid Lovenox worsening anemia  Code Status: Full Code  Maretta Bees,   MD. 06/20/2011, 11:51 AM

## 2011-06-20 NOTE — Progress Notes (Signed)
Patient ambulated in the hall with significant other. Brittany Dudley

## 2011-06-21 LAB — PHOSPHORUS: Phosphorus: 4.6 mg/dL (ref 2.3–4.6)

## 2011-06-21 LAB — CBC
HCT: 23 % — ABNORMAL LOW (ref 36.0–46.0)
Hemoglobin: 7 g/dL — ABNORMAL LOW (ref 12.0–15.0)
RBC: 3.77 MIL/uL — ABNORMAL LOW (ref 3.87–5.11)
WBC: 6.9 10*3/uL (ref 4.0–10.5)

## 2011-06-21 MED ORDER — HYDROCODONE-ACETAMINOPHEN 10-325 MG PO TABS: 1.0000 | ORAL_TABLET | Freq: Three times a day (TID) | ORAL | Status: AC | PRN

## 2011-06-21 MED ORDER — CIPROFLOXACIN HCL 500 MG PO TABS: 500.0000 mg | ORAL_TABLET | Freq: Two times a day (BID) | ORAL | Status: AC

## 2011-06-21 MED ORDER — MORPHINE SULFATE 2 MG/ML IJ SOLN
2.0000 mg | INTRAMUSCULAR | Status: DC | PRN
  Administered 2011-06-21: 2 mg via INTRAVENOUS
  Filled 2011-06-21: qty 1

## 2011-06-21 MED ORDER — FERROUS SULFATE 325 (65 FE) MG PO TABS: 325.0000 mg | ORAL_TABLET | Freq: Two times a day (BID) | ORAL | Status: DC

## 2011-06-21 NOTE — Discharge Summary (Signed)
PATIENT DETAILS Name: Brittany Dudley Age: 46 y.o. Sex: female Date of Birth: 05-May-1965 MRN: [id number]. Admit Date: 06/17/2011 Admitting Physician: Rometta Emery, MD PCP:No primary provider on file.  PRIMARY DISCHARGE DIAGNOSIS:  Principal Problem:  *Acute pyelonephritis Active Problems:  ARF (acute renal failure)  Hypokalemia  Alcoholism /alcohol abuse  Tobacco abuse Anemia-chronic Large Uterine Fibroids      PAST MEDICAL HISTORY: Past Medical History  Diagnosis Date  . Hypertension     DISCHARGE MEDICATIONS: Medication List  As of 06/21/2011  2:40 PM   TAKE these medications         ciprofloxacin 500 MG tablet   Commonly known as: CIPRO   Take 1 tablet (500 mg total) by mouth 2 (two) times daily.      ferrous sulfate 325 (65 FE) MG tablet   Take 1 tablet (325 mg total) by mouth 2 (two) times daily.      HYDROcodone-acetaminophen 10-325 MG per tablet   Commonly known as: NORCO   Take 1-2 tablets by mouth every 8 (eight) hours as needed.             BRIEF HPI:  See H&P, Labs, Consult and Test reports for all details in brief, yo with history of alcohol and Tobacco abuse here with abdominal pain, NV. Has had fever and chills with some flank pain. CT abdomen suggestsacute Pyelonephritis.  CONSULTATIONS:   None  PERTINENT RADIOLOGIC STUDIES: Ct Abdomen Pelvis W Contrast  06/18/2011  *RADIOLOGY REPORT*  Clinical Data: Bilateral mid to lower abdominal pain, nausea and vomiting.  CT ABDOMEN AND PELVIS WITH CONTRAST  Technique:  Multidetector CT imaging of the abdomen and pelvis was performed following the standard protocol during bolus administration of intravenous contrast.  Contrast:  100 mL of Omnipaque 300 IV contrast  Comparison: Abdominal radiograph performed earlier today at 07:29 p.m.  Findings: Minimal bibasilar atelectasis is noted.  Minimal prominence of the intrahepatic biliary ducts remains within normal limits status post cholecystectomy.  The liver is  otherwise unremarkable in appearance.  The spleen is unremarkable.  Clips are noted at the gallbladder fossa.  The pancreas and adrenal glands are unremarkable.  There is diffuse heterogeneity and decreased enhancement of both kidneys, compatible with bilateral pyelonephritis.  No focal masses are identified.  Minimal associated perinephric stranding is noted. There is no evidence of hydronephrosis.  No renal or ureteral stones are identified.  No free fluid is identified.  The small bowel is unremarkable in appearance.  The stomach is within normal limits.  No acute vascular abnormalities are seen.  The appendix is normal in caliber and contains minimal air, without evidence for appendicitis.  The colon is grossly unremarkable in appearance.  The bladder is mildly distended, though displaced by the patient's enlarged fibroid uterus.  A significantly enlarged uterus is noted, largely filling the pelvis and extending to the level of the umbilicus, with multiple prominent heterogeneous and partially calcified fibroids.  No inguinal lymphadenopathy is seen.  No acute osseous abnormalities are identified.  Vacuum phenomenon and disc space narrowing are noted at L5-S1, with associated endplate irregularity and sclerosis.  IMPRESSION:  1.  Diffuse acute bilateral pyelonephritis noted. 2.  Significantly enlarged fibroid uterus largely fills the pelvis and extends to the level of the umbilicus. 3.  Mild degenerative change at L5-S1.  Original Report Authenticated By: Tonia Ghent, M.D.   Dg Abd 2 Views  06/20/2011  *RADIOLOGY REPORT*  Clinical Data: Abdominal pain.  Rectal bleeding.  ABDOMEN - 2  VIEW  Comparison: 06/17/2011  Findings: A few gas filled small bowel loops are seen which are no longer dilated.  There is also gas seen throughout nondilated colon to the level of the rectum.  Scattered air fluid levels are noted. No evidence of free air.  Surgical clips are seen from prior cholecystectomy.  IMPRESSION:  Nonspecific bowel gas pattern.  No evidence of dilated bowel loops.  Original Report Authenticated By: Danae Orleans, M.D.   Dg Abd Acute W/chest  06/17/2011  *RADIOLOGY REPORT*  Clinical Data: Abdominal pain  ACUTE ABDOMEN SERIES (ABDOMEN 2 VIEW & CHEST 1 VIEW)  Comparison: None  Findings: Heart size is normal.  No pleural effusion or pulmonary edema.  No airspace consolidation.  Bowel gas pattern is nonspecific.  There are several dilated loops of central small bowel which measure up to 2.9 cm.  There are multiple fluid levels identified within the left abdomen.  There is a paucity of colonic gas.  IMPRESSION:  1.  Mildly dilated loops of small bowel and air-fluid levels. Cannot rule out low grade partial obstruction or small bowel ileus. 2.  No active cardiopulmonary abnormalities.  Original Report Authenticated By: Rosealee Albee, M.D.     PERTINENT LAB RESULTS: CBC:  Basename 06/21/11 0522 06/20/11 0512  WBC 6.9 4.8  HGB 7.0* 6.9*  HCT 23.0* 22.4*  PLT 245 155   CMET CMP     Component Value Date/Time   NA 136 06/20/2011 0512   K 3.9 06/20/2011 0512   CL 104 06/20/2011 0512   CO2 23 06/20/2011 0512   GLUCOSE 89 06/20/2011 0512   BUN 6 06/20/2011 0512   CREATININE 0.95 06/20/2011 0512   CALCIUM 8.8 06/20/2011 0512   PROT 6.2 06/20/2011 0512   ALBUMIN 2.0* 06/20/2011 0512   AST 121* 06/20/2011 0512   ALT 58* 06/20/2011 0512   ALKPHOS 215* 06/20/2011 0512   BILITOT 0.2* 06/20/2011 0512   GFRNONAA 72* 06/20/2011 0512   GFRAA 83* 06/20/2011 0512    GFR Estimated Creatinine Clearance: 79 ml/min (by C-G formula based on Cr of 0.95). No results found for this basename: LIPASE:2,AMYLASE:2 in the last 72 hours No results found for this basename: CKTOTAL:3,CKMB:3,CKMBINDEX:3,TROPONINI:3 in the last 72 hours No components found with this basename: POCBNP:3 No results found for this basename: DDIMER:2 in the last 72 hours No results found for this basename: HGBA1C:2 in the last 72 hours No  results found for this basename: CHOL:2,HDL:2,LDLCALC:2,TRIG:2,CHOLHDL:2,LDLDIRECT:2 in the last 72 hours No results found for this basename: TSH,T4TOTAL,FREET3,T3FREE,THYROIDAB in the last 72 hours  Basename 06/18/11 1720  VITAMINB12 553  FOLATE 19.4  FERRITIN 79  TIBC Not calculated due to Iron <10.  IRON <10*  RETICCTPCT <0.4*   Coags:  Basename 06/19/11 0535  INR 0.97   Microbiology: Recent Results (from the past 240 hour(s))  URINE CULTURE     Status: Normal   Collection Time   06/17/11  8:49 PM      Component Value Range Status Comment   Specimen Description URINE, RANDOM   Final    Special Requests NONE   Final    Culture  Setup Time [id number]   Final    Colony Count >=100,000 COLONIES/ML   Final    Culture ESCHERICHIA COLI   Final    Report Status 06/20/2011 FINAL   Final    Organism ID, Bacteria ESCHERICHIA COLI   Final      BRIEF HOSPITAL COURSE:   Principal Problem:  *Acute pyelonephritis -  patient presented with lower abdominal and bilateral flank pain, a CT Scan of the abdomen done on admission was suggestive of diffuse acute bilateral pyelonephritis. Urine cultures was positive for E. Coli which was pan sensitive, she was empiric zosyn initially, after her urine culture results were obtained, she was then transitioned to ciprofloxacin. She was also on admission having nausea and vomiting, which have all resolved, and she is now tolerating a regular diet. She continues to have lower abdominal and some back pain-some of which is chronic, but still is significantly better than what she presented with. She is ambulatory in hallway, PT has seen her and recommended a rolling walker and that has been provided to the patient as well. She will be provided with narcotics for a few days, as she claims that the pain is not controlled by tylenol only.Back pain is likely multifactorial-large fibroid/resolving pyelonephritis/chronic back pain. She has not motor deficits or any  weakness. No urinary or fecal incontinence as well.  Alcoholism /alcohol abuse  -awake, and alert-non tremulous  -required-Ativan per CIWAA scale  -have counselled extensively regarding the importance of completely stopping ETOH use   Cocaine use  -patient has been counselled extensively-she however claimed to have "handled it" and never used it.  Hypokalemia  -likely secondary to GI loss and ETOH  -resolved  ARF (acute renal failure)  -resolved, likely pre-renal   Anemia  -Iron deficiency-likely secondary to chronic menorrhagia-has long standing history of anemia-from review of her prior hospitalization in 2011  -per patient she has long standing history of menorrhagia secondary to Fibroids-CT abdomen done on admission does show enlarged uterus upto level of umbilicus- she never has seen a GYN MD for this -patient claims that she gets frequent IV Iron at [facility] refuses a Blood transfusion-she is a Jehovah's witness  -no overt evidence of a GI bleed-per RN stools are brown in color -she received IV Iron during her hospital stay-her discharge Hb is 7.0 -[facility].  Uterine Fibroids -as above  Tobacco abuse  -have counseled extensivelly  TODAY-DAY OF DISCHARGE:  Subjective:   Brittany Dudley today has no headache,no chest abdominal pain,no new weakness tingling or numbness, feels much better wants to go home today. She continues to have intermittent back pain-but is better than what it was over the past few days. She has no rectal bleeding. She is ambulatory and is tolerating a regular diet.  Objective:   Blood pressure 155/93, pulse 70, temperature 97.6 F (36.4 C), temperature source Oral, resp. rate 18, height 5\' 9"  (1.753 m), weight 71.2 kg (156 lb 15.5 oz), last menstrual period 05/30/2011, SpO2 99.00%.  Intake/Output Summary (Last 24 hours) at 06/21/11 1440 Last data filed at 06/21/11 1300  Gross  per 24 hour  Intake 1655.33 ml  Output   1300 ml  Net 355.33 ml    Exam Awake Alert, Oriented *3, No new F.N deficits, Normal affect [place].AT,PERRAL Supple Neck,No JVD, No cervical lymphadenopathy appriciated.  Symmetrical Chest wall movement, Good air movement bilaterally, CTAB RRR,No Gallops,Rubs or new Murmurs, No Parasternal Heave +ve B.Sounds, Abd Soft, Non tender, No organomegaly appriciated, No rebound -guarding or rigidity. No Cyanosis, Clubbing or edema, No new Rash or bruise  DISPOSITION: Home  DISCHARGE INSTRUCTIONS:    Follow-up Information    Follow up with [facility] on 07/25/2011. (appointment time 3 pm)    Contact information:   [address] [place]  [place] [address] phone: [phone]        Total Time spent on discharge equals 45 minutes.  SignedJeoffrey Massed 06/21/2011 2:40 PM

## 2011-06-21 NOTE — Progress Notes (Addendum)
Patients IV access infiltrated. Refused IV re-insertion. Explained the importance of IV access. Per patient " I don't need it anymore, I'm going home. I have no bleeding , I'm fine". MD on call notified

## 2011-06-21 NOTE — Progress Notes (Signed)
   CARE MANAGEMENT NOTE 06/21/2011  Patient:  DERETHA, ERTLE   Account Number:  [id number]  Date Initiated:  06/18/2011  Documentation initiated by:  Donn Pierini  Subjective/Objective Assessment:   Pt admitted with acute pyelonephritis     Action/Plan:   PTA pt lived at home with spouse, was independent with ADLs   Anticipated DC Date:  06/20/2011   Anticipated DC Plan:  HOME/SELF CARE  In-house referral  Clinical Social Worker      DC Planning Services  CM consult  Follow-up appt scheduled      [facility] Choice  DURABLE MEDICAL EQUIPMENT   Choice offered to / List presented to:  C-1 Patient   DME arranged  Levan Hurst      DME agency  [facility]        Status of service:  Completed, signed off Medicare Important Message given?   (If response is "NO", the following Medicare IM given date fields will be blank) Date Medicare IM given:   Date Additional Medicare IM given:    Discharge Disposition:  HOME/SELF CARE  Per UR Regulation:  Reviewed for med. necessity/level of care/duration of stay  If discussed at Long Length of Stay Meetings, dates discussed:    Comments:  PCP- pt not sure (does not have Medicaid card with her)  06/21/11- 1100- Donn Pierini RN, BSN [phone] Pt for discharge today- CSW to provide buss pass.  06/20/11- 1420- Donn Pierini RN, BSN [phone] Per MD request call made to [facility] outpt clinic to arrange f/u outpt appointment- appointment set for May 2 at 3pm at the clinic. Order also for RW- spoke with pt at bedside- informed her about output f/u appoinment with the clinic for GYN f/u. Choice also offered for DME- pt wants to use [facility] for DME- referral called to [facility] for RW- message left for [place]. Plan if for pt to d/c tomorrow.  06/18/11- 1550- Donn Pierini RN, BSN [phone] Spoke with pt regarding potential d/c needs per conversation pt has current Medicaid card and uses Medicaid benefits for medications. She does  not know how her listed PCP is on her card. Called [facility] to see if perhaps they may have pt in system- they do not. Pt states that she will need assistance with transportation at discharge- will consult CSW for bus pass. CM to follow for potential d/c needs.

## 2011-06-21 NOTE — Progress Notes (Signed)
Clinical Social Work Department BRIEF PSYCHOSOCIAL ASSESSMENT 06/21/2011  Patient:  Brittany Dudley, Brittany Dudley     Account Number:  [id number]     Admit date:  06/17/2011  Clinical Social Worker:  Dennison Bulla  Date/Time:  06/21/2011 10:15 AM  Referred by:  Physician  Date Referred:  06/21/2011 Referred for  Transportation assistance  Substance Abuse   Other Referral:   Interview type:  Patient Other interview type:   Asked boyfriend to step out of room    PSYCHOSOCIAL DATA Living Status:  OTHER Admitted from facility:   Level of care:   Primary support name:  Ronnie Primary support relationship to patient:  FRIEND Degree of support available:   Adequate    CURRENT CONCERNS Current Concerns  Substance Abuse   Other Concerns:    SOCIAL WORK ASSESSMENT / PLAN CSW met with patient at bedside and asked boyfriend to step out of room. CSW spoke with patient regarding her current living arrangements and reason for admission to hospital. Patient reports that she has been drinking more often. CSW completed SBIRT with patient and determined that patient needed to reduce her drinking habits. Patient reports that she has been feeling physically worse since drinking and wants to quit. CSW explained that she could assist with community resources or counseling but patient reported that she would quit drinking on her own. Please see SBIRT for further details. CSW encouraged patient to notifiy CSW if she changed her mind regarding wanting resources. CSW provided RN with 2 bus passes for patient once she is dc to get home.   Assessment/plan status:  No Further Intervention Required Other assessment/ plan:   Information/referral to community resources:   Offered SA resources but patient declined    PATIENT'S/FAMILY'S RESPONSE TO PLAN OF CARE: Patient was pleasant and agreeable to assessment. Patient aware of drinking habits and negative influences. Patient reports she wishes to quit drinking  altogether.

## 2011-07-25 ENCOUNTER — Ambulatory Visit (INDEPENDENT_AMBULATORY_CARE_PROVIDER_SITE_OTHER): Payer: Medicaid Other | Admitting: Obstetrics & Gynecology

## 2011-07-25 ENCOUNTER — Encounter: Payer: Self-pay | Admitting: Obstetrics & Gynecology

## 2011-07-25 ENCOUNTER — Other Ambulatory Visit: Payer: Self-pay | Admitting: Obstetrics & Gynecology

## 2011-07-25 ENCOUNTER — Other Ambulatory Visit (HOSPITAL_COMMUNITY)
Admission: RE | Admit: 2011-07-25 | Discharge: 2011-07-25 | Disposition: A | Discharge status: Home / Self Care | Payer: Medicaid Other | Source: Ambulatory Visit | Attending: Obstetrics & Gynecology | Admitting: Obstetrics & Gynecology

## 2011-07-25 VITALS — BP 172/102 | HR 84 | Temp 97.9°F | Ht 67.0 in | Wt 173.0 lb

## 2011-07-25 DIAGNOSIS — Z01419 Encounter for gynecological examination (general) (routine) without abnormal findings: Secondary | ICD-10-CM | POA: Insufficient documentation

## 2011-07-25 DIAGNOSIS — D259 Leiomyoma of uterus, unspecified: Secondary | ICD-10-CM

## 2011-07-25 LAB — POCT PREGNANCY, URINE: Preg Test, Ur: NEGATIVE

## 2011-07-25 NOTE — Progress Notes (Signed)
Subjective:    Patient ID: Brittany Dudley, female    DOB: 1966-03-21, 46 y.o.   MRN: [id number]  [id number] Patient's last menstrual period was 07/18/2011. Heavy, painful menses, recently treated for pyelonephritis, CT showed fibroid uterus, which had been previously diagnosed. Large clots, periods last 7 days. Had been advised to have TAH in past, declined transfusion.   Past Medical History  Diagnosis Date  . Hypertension   . Anemia   . Fibroids   . Menorrhagia   . Depression   . Hernia    Past Surgical History  Procedure Date  . Cholecystectomy    Current outpatient prescriptions:ferrous sulfate (FERROUSUL) 325 (65 FE) MG tablet, Take 1 tablet (325 mg total) by mouth 2 (two) times daily., Disp: 60 tablet, Rfl: 0 Family History  Problem Relation Age of Onset  . Hypertension Mother   . Depression Mother   . Cancer Father   . Diabetes Father   No Known Allergies History   Social History  . Marital Status: Single    Spouse Name: N/A    Number of Children: N/A  . Years of Education: N/A   Occupational History  . Not on file.   Social History Main Topics  . Smoking status: Current Everyday Smoker -- 2.0 packs/day    Types: Cigarettes  . Smokeless tobacco: Never Used  . Alcohol Use: Yes     pt states she drinks everyday "a lot"  . Drug Use: No  . Sexually Active: Yes    Birth Control/ Protection: None   Other Topics Concern  . Not on file   Social History Narrative  . No narrative on file     Review of Systems Pain in pelvis, menorrhagia, discharge, occasional diarrhea, constipation     Objective:   Physical Exam Filed Vitals:   07/25/11 1455  BP: 172/102  Pulse: 84  Temp: 97.9 F (36.6 C)  TempSrc: Oral  Height: 5\' 7"  (1.702 m)  Weight: 173 lb (78.472 kg)  Anxious Abdomen inconsistent tenderness, mass low in pelvis Pelvic EBUS nl, blood in vault, wet prep, dis not tolerate exam, pap done, cervix anteverted, uterus 12 weeks  Extremity no swelling    CBC    Component Value Date/Time   WBC 6.9 06/21/2011 0522   RBC 3.77* 06/21/2011 0522   HGB 7.0* 06/21/2011 0522   HCT 23.0* 06/21/2011 0522   PLT 245 06/21/2011 0522   MCV 61.0* 06/21/2011 0522   MCH 18.6* 06/21/2011 0522   MCHC 30.4 06/21/2011 0522   RDW 21.5* 06/21/2011 0522   LYMPHSABS 1.0 06/17/2011 1830   MONOABS 1.1* 06/17/2011 1830   EOSABS 0.0 06/17/2011 1830   BASOSABS 0.0 06/17/2011 1830      *RADIOLOGY REPORT*  Clinical Data: Bilateral mid to lower abdominal pain, nausea and  vomiting.  CT ABDOMEN AND PELVIS WITH CONTRAST  Technique: Multidetector CT imaging of the abdomen and pelvis was  performed following the standard protocol during bolus  administration of intravenous contrast.  Contrast: 100 mL of Omnipaque 300 IV contrast  Comparison: Abdominal radiograph performed earlier today at 07:29  p.m.  Findings: Minimal bibasilar atelectasis is noted.  Minimal prominence of the intrahepatic biliary ducts remains within  normal limits status post cholecystectomy. The liver is otherwise  unremarkable in appearance. The spleen is unremarkable. Clips are  noted at the gallbladder fossa. The pancreas and adrenal glands  are unremarkable.  There is diffuse heterogeneity and decreased enhancement of both  kidneys, compatible with bilateral pyelonephritis. No  focal masses  are identified. Minimal associated perinephric stranding is noted.  There is no evidence of hydronephrosis. No renal or ureteral  stones are identified.  No free fluid is identified. The small bowel is unremarkable in  appearance. The stomach is within normal limits. No acute  vascular abnormalities are seen.  The appendix is normal in caliber and contains minimal air, without  evidence for appendicitis. The colon is grossly unremarkable in  appearance.  The bladder is mildly distended, though displaced by the patient's  enlarged fibroid uterus. A significantly enlarged uterus is noted,  largely filling  the pelvis and extending to the level of the  umbilicus, with multiple prominent heterogeneous and partially  calcified fibroids. No inguinal lymphadenopathy is seen.  No acute osseous abnormalities are identified. Vacuum phenomenon  and disc space narrowing are noted at L5-S1, with associated  endplate irregularity and sclerosis.  IMPRESSION:  1. Diffuse acute bilateral pyelonephritis noted.  2. Significantly enlarged fibroid uterus largely fills the pelvis  and extends to the level of the umbilicus.  3. Mild degenerative change at L5-S1.  Original Report Authenticated By: Tonia Ghent, M.D.     Assessment & Plan:  Fibroid, menorrhagia, anemia  Pelvic US Pap sent Lupron depot 11.25 mg IM RTC 2 weeks  Dohn Stclair 07/25/2011 3:46 PM

## 2011-07-25 NOTE — Patient Instructions (Signed)
Fibroids You have been diagnosed as having a fibroid. Fibroids are smooth muscle lumps (tumors) which can occur any place in a woman's body. They are usually in the womb (uterus). The most common problem (symptom) of fibroids is bleeding. Over time this may cause low red blood cells (anemia). Other symptoms include feelings of pressure and pain in the pelvis. The diagnosis (learning what is wrong) of fibroids is made by physical exam. Sometimes tests such as an ultrasound are used. This is helpful when fibroids are felt around the ovaries and to look for tumors. TREATMENT   Most fibroids do not need surgical or medical treatment. Sometimes a tissue sample (biopsy) of the lining of the uterus is done to rule out cancer. If there is no cancer and only a small amount of bleeding, the problem can be watched.   Hormonal treatment can improve the problem.   When surgery is needed, it can consist of removing the fibroid. Vaginal birth may not be possible after the removal of fibroids. This depends on where they are and the extent of surgery. When pregnancy occurs with fibroids it is usually normal.   Your caregiver can help decide which treatments are best for you.  HOME CARE INSTRUCTIONS   Do not use aspirin as this may increase bleeding problems.   If your periods (menses) are heavy, record the number of pads or tampons used per month. Bring this information to your caregiver. This can help them determine the best treatment for you.  SEEK IMMEDIATE MEDICAL CARE IF:  You have pelvic pain or cramps not controlled with medications, or experience a sudden increase in pain.   You have an increase of pelvic bleeding between and during menses.   You feel lightheaded or have fainting spells.   You develop worsening belly (abdominal) pain.  Document Released: 03/08/2000 Document Revised: 02/28/2011 Document Reviewed: 10/29/2007 Exit[employer] Patient Information 2012 [employer].    

## 2011-07-26 LAB — WET PREP, GENITAL: Yeast Wet Prep HPF POC: NONE SEEN

## 2011-07-30 ENCOUNTER — Other Ambulatory Visit: Payer: Self-pay | Admitting: Obstetrics & Gynecology

## 2011-07-30 ENCOUNTER — Ambulatory Visit (HOSPITAL_COMMUNITY)
Admission: RE | Admit: 2011-07-30 | Discharge: 2011-07-30 | Disposition: A | Discharge status: Home / Self Care | Payer: Medicaid Other | Source: Ambulatory Visit | Attending: Obstetrics & Gynecology | Admitting: Obstetrics & Gynecology

## 2011-07-30 DIAGNOSIS — N938 Other specified abnormal uterine and vaginal bleeding: Secondary | ICD-10-CM | POA: Insufficient documentation

## 2011-07-30 DIAGNOSIS — D250 Submucous leiomyoma of uterus: Secondary | ICD-10-CM | POA: Insufficient documentation

## 2011-07-30 DIAGNOSIS — D259 Leiomyoma of uterus, unspecified: Secondary | ICD-10-CM

## 2011-07-30 DIAGNOSIS — N949 Unspecified condition associated with female genital organs and menstrual cycle: Secondary | ICD-10-CM | POA: Insufficient documentation

## 2011-07-30 NOTE — Progress Notes (Signed)
Sw met with pt briefly to provide a meal ticket, upon her request.

## 2011-08-05 ENCOUNTER — Telehealth: Payer: Self-pay | Admitting: *Deleted

## 2011-08-05 NOTE — Telephone Encounter (Signed)
Patient left a message stating that she would like to get her results.

## 2011-08-06 MED ORDER — METRONIDAZOLE 500 MG PO TABS: 500.0000 mg | ORAL_TABLET | Freq: Two times a day (BID) | ORAL | Status: DC

## 2011-08-06 NOTE — Telephone Encounter (Signed)
Call returned to pt. I informed her of normal Pap with the exception of presence of trichomoniasis infection.  I explained that this is a sexually transmitted infection and her partner will need to be treated as well. He may go to his own doctor or to the [facility] with an appt.  Pt was quite surprised and asked if I was sure because she only has 1 partner. I stated that this infection was present on both her Pap and the wet prep performed on the same Reed Eifert.  Pt was further instructed not to have intercourse with the same partner until he has been treated or she will risk becoming infected again. I also stated that Dr. Debroah Loop will discuss her [place] results @ her next visit. Pt voiced understanding.

## 2011-08-13 ENCOUNTER — Telehealth: Payer: Self-pay | Admitting: *Deleted

## 2011-08-13 DIAGNOSIS — A599 Trichomoniasis, unspecified: Secondary | ICD-10-CM

## 2011-08-13 MED ORDER — METRONIDAZOLE 500 MG PO TABS: 500.0000 mg | ORAL_TABLET | Freq: Two times a day (BID) | ORAL | Status: AC
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
## 2011-08-22 ENCOUNTER — Ambulatory Visit: Payer: Medicaid Other | Admitting: Obstetrics & Gynecology

## 2011-09-12 ENCOUNTER — Encounter: Payer: Self-pay | Admitting: Advanced Practice Midwife

## 2011-09-12 ENCOUNTER — Ambulatory Visit (INDEPENDENT_AMBULATORY_CARE_PROVIDER_SITE_OTHER): Payer: Medicaid Other | Admitting: Advanced Practice Midwife

## 2011-09-12 VITALS — BP 148/98 | HR 84 | Temp 97.0°F | Ht 67.0 in | Wt 164.7 lb

## 2011-09-12 DIAGNOSIS — N949 Unspecified condition associated with female genital organs and menstrual cycle: Secondary | ICD-10-CM

## 2011-09-12 DIAGNOSIS — R102 Pelvic and perineal pain: Secondary | ICD-10-CM

## 2011-09-12 DIAGNOSIS — N898 Other specified noninflammatory disorders of vagina: Secondary | ICD-10-CM

## 2011-09-12 DIAGNOSIS — D259 Leiomyoma of uterus, unspecified: Secondary | ICD-10-CM | POA: Insufficient documentation

## 2011-09-12 DIAGNOSIS — N946 Dysmenorrhea, unspecified: Secondary | ICD-10-CM

## 2011-09-12 LAB — POCT URINALYSIS DIP (DEVICE)
Hgb urine dipstick: NEGATIVE
Nitrite: NEGATIVE
Specific Gravity, Urine: >=1.03 (ref 1.005–1.030)
Urobilinogen, UA: 0.2 mg/dL (ref 0.0–1.0)
pH: 6 (ref 5.0–8.0)

## 2011-09-12 MED ORDER — DICLOFENAC SODIUM 75 MG PO TBEC: 75.0000 mg | DELAYED_RELEASE_TABLET | Freq: Two times a day (BID) | ORAL | Status: DC
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
## 2011-09-12 NOTE — Patient Instructions (Addendum)
Fibroids You have been diagnosed as having a fibroid. Fibroids are smooth muscle lumps (tumors) which can occur any place in a woman's body. They are usually in the womb (uterus). The most common problem (symptom) of fibroids is bleeding. Over time this may cause low red blood cells (anemia). Other symptoms include feelings of pressure and pain in the pelvis. The diagnosis (learning what is wrong) of fibroids is made by physical exam. Sometimes tests such as an ultrasound are used. This is helpful when fibroids are felt around the ovaries and to look for tumors. TREATMENT   Most fibroids do not need surgical or medical treatment. Sometimes a tissue sample (biopsy) of the lining of the uterus is done to rule out cancer. If there is no cancer and only a small amount of bleeding, the problem can be watched.   Hormonal treatment can improve the problem.   When surgery is needed, it can consist of removing the fibroid. Vaginal birth may not be possible after the removal of fibroids. This depends on where they are and the extent of surgery. When pregnancy occurs with fibroids it is usually normal.   Your caregiver can help decide which treatments are best for you.  HOME CARE INSTRUCTIONS   Do not use aspirin as this may increase bleeding problems.   If your periods (menses) are heavy, record the number of pads or tampons used per month. Bring this information to your caregiver. This can help them determine the best treatment for you.  SEEK IMMEDIATE MEDICAL CARE IF:  You have pelvic pain or cramps not controlled with medications, or experience a sudden increase in pain.   You have an increase of pelvic bleeding between and during menses.   You feel lightheaded or have fainting spells.   You develop worsening belly (abdominal) pain.  Document Released: 03/08/2000 Document Revised: 02/28/2011 Document Reviewed: 10/29/2007 [employer] Patient Information 2012 [employer].    

## 2011-09-12 NOTE — Addendum Note (Signed)
Addended by: Dorathy Kinsman on: 09/12/2011 08:24 PM   Modules accepted: Level of Service

## 2011-09-13 NOTE — Addendum Note (Signed)
Addended by: Doreen Salvage on: 09/13/2011 07:44 AM   Modules accepted: Orders

## 2011-09-14 LAB — WET PREP, GENITAL
WBC, Wet Prep HPF POC: NONE SEEN
Yeast Wet Prep HPF POC: NONE SEEN

## 2011-09-14 LAB — GC/CHLAMYDIA PROBE AMP, GENITAL
Chlamydia, DNA Probe: NEGATIVE
GC Probe Amp, Genital: NEGATIVE

## 2011-09-18 ENCOUNTER — Encounter (HOSPITAL_COMMUNITY): Payer: Self-pay | Admitting: *Deleted

## 2011-09-18 ENCOUNTER — Telehealth: Payer: Self-pay

## 2011-09-18 ENCOUNTER — Emergency Department (HOSPITAL_COMMUNITY)
Admission: EM | Admit: 2011-09-18 | Discharge: 2011-09-18 | Disposition: A | Discharge status: Home / Self Care | Payer: Medicaid Other | Attending: Emergency Medicine | Admitting: Emergency Medicine

## 2011-09-18 DIAGNOSIS — I10 Essential (primary) hypertension: Secondary | ICD-10-CM | POA: Insufficient documentation

## 2011-09-18 DIAGNOSIS — K047 Periapical abscess without sinus: Secondary | ICD-10-CM | POA: Insufficient documentation

## 2011-09-18 DIAGNOSIS — F17200 Nicotine dependence, unspecified, uncomplicated: Secondary | ICD-10-CM | POA: Insufficient documentation

## 2011-09-18 DIAGNOSIS — D649 Anemia, unspecified: Secondary | ICD-10-CM | POA: Insufficient documentation

## 2011-09-18 MED ORDER — OXYCODONE-ACETAMINOPHEN 7.5-325 MG PO TABS: 1.0000 | ORAL_TABLET | ORAL | Status: AC | PRN

## 2011-09-18 MED ORDER — PENICILLIN V POTASSIUM 500 MG PO TABS: 500.0000 mg | ORAL_TABLET | Freq: Four times a day (QID) | ORAL | Status: AC

## 2011-09-18 MED ORDER — OXYCODONE-ACETAMINOPHEN 5-325 MG PO TABS
2.0000 | ORAL_TABLET | Freq: Once | ORAL | Status: AC
  Administered 2011-09-18: 2 via ORAL
  Filled 2011-09-18: qty 2

## 2011-09-18 NOTE — Discharge Instructions (Signed)
Abscessed Tooth A tooth abscess is a collection of infected fluid (pus) from a bacterial infection in the inner part of the tooth (pulp). It usually occurs at the end of the tooth's root.  CAUSES   A very bad cavity (extensive tooth decay).   Trauma to the tooth, such as a broken or chipped tooth, that allows bacteria to enter into the pulp.  SYMPTOMS  Severe pain in and around the infected tooth.   Swelling and redness around the abscessed tooth or in the mouth or face.   Tenderness.   Pus drainage.   Bad breath.   Bitter taste in the mouth.   Difficulty swallowing.   Difficulty opening the mouth.   Feeling sick to your stomach (nauseous).   Vomiting.   Chills.   Swollen neck glands.  DIAGNOSIS  A medical and dental history will be taken.   An examination will be performed by tapping on the abscessed tooth.   X-rays may be taken of the tooth to identify the abscess.  TREATMENT The goal of treatment is to eliminate the infection.   You may be prescribed antibiotic medicine to stop the infection from spreading.   A root canal may be performed to save the tooth. If the tooth cannot be saved, it may be pulled (extracted) and the abscess may be drained.  HOME CARE INSTRUCTIONS  Only take over-the-counter or prescription medicines for pain, fever, or discomfort as directed by your caregiver.   Do not drive after taking pain medicine (narcotics).   Rinse your mouth (gargle) often with salt water ( tsp salt in 8 oz of warm water) to relieve pain or swelling.   Do not apply heat to the outside of your face.   Return to your dentist for further treatment as directed.  SEEK IMMEDIATE DENTAL CARE IF:  You have a temperature by mouth above 102 F (38.9 C), not controlled by medicine.   You have chills or a very bad headache.   You have problems breathing or swallowing.   Your have trouble opening your mouth.   You develop swelling in the neck or around the eye.    Your pain is not helped by medicine.   Your pain is getting worse instead of better.  Document Released: 03/11/2005 Document Revised: 02/28/2011 Document Reviewed: 06/19/2010 [facility] Patient Information 2012 [place], [place].  RESOURCE GUIDE   Dental Assistance  If unable to pay or uninsured, contact:  [facility] or [facility]. to become qualified for the adult dental clinic.  Patients with Medicaid: [facility] [phone] [address], [phone] [address] [phone]  If unable to pay, or uninsured, contact [facility] [phone]) or [facility] Department [phone] in [place], [phone] in [facility]) to become qualified for the adult dental clinic  Other Low-Cost Community Dental Services: - [facility]- [address] [place], [place], [place], [address], [phone], Ext. [phone], 2nd and 4th Thursday of the month at 6:30am.  10 clients each day by appointment, can sometimes see walk-in patients if someone does not show for an appointment. [facility]- [address] [place], [place], [address], [phone] - [facility]- [address] [place], [place], [address], [phone] - [facility]- [phone] [facility] Health Department- [phone] [facility] Department- [phone]

## 2011-09-18 NOTE — Telephone Encounter (Addendum)
Pt called and stated that she wanted to know her results from the 20th? Called pt and informed pt that her results were normal. Pt was excited.  Pt then asked if she had appt scheduled for her endo bx to be scheduled in the OR.  I called [place], Sharon picked up, and she informed me that there are no appt scheduled for the pt to have endo bx in the OR.  I informed pt of this info and stated that I would send the provider who saw her that day a note so that we could determine when we can get that scheduled.  I informed pt to please give me till Friday to get a response from the provider and that either way I will call her whether I received a note back or if I haven't heard anything.  Pt stated that would be fine and looks forward to hearing from me on Friday.

## 2011-09-18 NOTE — ED Notes (Signed)
Reports waking up yesterday with small amount of swelling to right side of face, now has increased and having severe toothache, airway is intact.

## 2011-09-18 NOTE — ED Provider Notes (Signed)
History    This chart was scribed for Brittany Shi, MD, MD by Smitty Pluck. The patient was seen in room TR05C and the patient's care was started at 12:39PM.   CSN: [id number]  Arrival date & time 09/18/11  1118   First MD Initiated Contact with Patient 09/18/11 1222      Chief Complaint  Patient presents with  . Dental Pain  . Facial Swelling    (Consider location/radiation/quality/duration/timing/severity/associated sxs/prior treatment) The history is provided by the patient.   Sarajane Fambrough is a 46 y.o. female who presents to the Emergency Department complaining of moderate dental pain and facial pain onset 1 day ago with symptoms worsening this AM. Pt reports that she has right side facial swelling. Symptoms have been constant since onset. Pain radiates to right jaw. Denies any other pain. Reports having hx of dental abscess.    Past Medical History  Diagnosis Date  . Hypertension   . Anemia   . Fibroids   . Menorrhagia   . Depression   . Hernia     Past Surgical History  Procedure Date  . Cholecystectomy     Family History  Problem Relation Age of Onset  . Hypertension Mother   . Depression Mother   . Cancer Father   . Diabetes Father     History  Substance Use Topics  . Smoking status: Current Everyday Smoker -- 2.0 packs/day    Types: Cigarettes  . Smokeless tobacco: Never Used  . Alcohol Use: Yes     pt states she drinks everyday "a lot"    OB History    Grav Para Term Preterm Abortions TAB SAB Ect Mult Living   4 3 3  1  1   3       Review of Systems  All other systems reviewed and are negative.  10 Systems reviewed and all are negative for acute change except as noted in the HPI.   Allergies  Review of patient's allergies indicates no known allergies.  Home Medications   Current Outpatient Rx  Name Route Sig Dispense Refill  . IBUPROFEN 200 MG PO TABS Oral Take 200-800 mg by mouth every 6 (six) hours as needed. For pain    .  OXYCODONE-ACETAMINOPHEN 7.5-325 MG PO TABS Oral Take 1 tablet by mouth every 4 (four) hours as needed for pain. 30 tablet 0  . PENICILLIN V POTASSIUM 500 MG PO TABS Oral Take 1 tablet (500 mg total) by mouth 4 (four) times daily. 40 tablet 0    BP 149/93  Pulse 81  Temp 98.2 F (36.8 C) (Oral)  Resp 16  SpO2 98%  LMP 08/28/2011  Physical Exam  Nursing note and vitals reviewed. Constitutional: She is oriented to person, place, and time. She appears well-developed and well-nourished. No distress.  HENT:  Head: Normocephalic and atraumatic.  Mouth/Throat: Uvula is midline, oropharynx is clear and moist and mucous membranes are normal. Dental abscesses and dental caries present. No uvula swelling.  Eyes: Pupils are equal, round, and reactive to light.  Neck: Normal range of motion.  Cardiovascular: Normal rate and intact distal pulses.   Pulmonary/Chest: No respiratory distress.  Abdominal: Normal appearance. She exhibits no distension.  Musculoskeletal: Normal range of motion.  Neurological: She is alert and oriented to person, place, and time. No cranial nerve deficit.  Skin: Skin is warm and dry. No rash noted.  Psychiatric: She has a normal mood and affect. Her behavior is normal.    ED  Course  Procedures (including critical care time) DIAGNOSTIC STUDIES: Oxygen Saturation is 98% on room air, normal by my interpretation.    COORDINATION OF CARE: 12:42PM EDP discusses pt ED treatment with pt.  12:42PM EDP ordered medication: percocet 5-325 mg tablet  Labs Reviewed - No data to display No results found.   1. Tooth abscess       MDM     I personally performed the services described in this documentation, which was scribed in my presence. The recorded information has been reviewed and considered.    Brittany Shi, MD 09/26/11 [phone]

## 2011-09-24 NOTE — Telephone Encounter (Signed)
Appointment scheduled for 10/02/11 at 12:45 pm with Dr. Macon Large.  Message sent to the clinical pool to contact patient with appointment information.

## 2011-09-24 NOTE — Telephone Encounter (Signed)
Called pt and informed pt of appt for "surgical consult" for her to set up her endo bx for 10/02/11 @ 1245.  Pt stated understanding and had no further questions.

## 2011-10-02 ENCOUNTER — Ambulatory Visit (INDEPENDENT_AMBULATORY_CARE_PROVIDER_SITE_OTHER): Payer: Medicaid Other | Admitting: Obstetrics & Gynecology

## 2011-10-02 ENCOUNTER — Encounter: Payer: Self-pay | Admitting: Obstetrics & Gynecology

## 2011-10-02 VITALS — BP 147/87 | HR 86 | Temp 98.1°F | Ht 67.0 in

## 2011-10-02 DIAGNOSIS — G8929 Other chronic pain: Secondary | ICD-10-CM

## 2011-10-02 DIAGNOSIS — Z01818 Encounter for other preprocedural examination: Secondary | ICD-10-CM

## 2011-10-02 DIAGNOSIS — D259 Leiomyoma of uterus, unspecified: Secondary | ICD-10-CM

## 2011-10-02 DIAGNOSIS — N938 Other specified abnormal uterine and vaginal bleeding: Secondary | ICD-10-CM

## 2011-10-02 DIAGNOSIS — N949 Unspecified condition associated with female genital organs and menstrual cycle: Secondary | ICD-10-CM

## 2011-10-02 MED ORDER — MEDROXYPROGESTERONE ACETATE 10 MG PO TABS: ORAL_TABLET | ORAL | Status: DC

## 2011-10-02 MED ORDER — DICLOFENAC SODIUM 75 MG PO TBEC: 75.0000 mg | DELAYED_RELEASE_TABLET | Freq: Two times a day (BID) | ORAL | Status: DC
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
## 2011-10-02 NOTE — Patient Instructions (Signed)
Endometrial Ablation Endometrial ablation removes the lining of the uterus (endometrium). It is usually a same day, outpatient treatment. Ablation helps avoid major surgery (such as a hysterectomy). A hysterectomy is removal of the cervix and uterus. Endometrial ablation has less risk and complications, has a shorter recovery period and is less expensive. After endometrial ablation, most women will have little or no menstrual bleeding. You may not keep your fertility. Pregnancy is no longer likely after this procedure but if you are pre-menopausal, you still need to use a reliable method of birth control following the procedure because pregnancy can occur. REASONS TO HAVE THE PROCEDURE MAY INCLUDE:  Heavy periods.   Bleeding that is causing anemia.   Anovulatory bleeding, very irregular, bleeding.   Bleeding submucous fibroids (on the lining inside the uterus) if they are smaller than 3 centimeters.  REASONS NOT TO HAVE THE PROCEDURE MAY INCLUDE:  You wish to have more children.   You have a pre-cancerous or cancerous problem. The cause of any abnormal bleeding must be diagnosed before having the procedure.   You have pain coming from the uterus.   You have a submucus fibroid larger than 3 centimeters.   You recently had a baby.   You recently had an infection in the uterus.   You have a severe retro-flexed, tipped uterus and cannot insert the instrument to do the ablation.   You had a Cesarean section or deep major surgery on the uterus.   The inner cavity of the uterus is too large for the endometrial ablation instrument.  RISKS AND COMPLICATIONS   Perforation of the uterus.   Bleeding.   Infection of the uterus, bladder or vagina.   Injury to surrounding organs.   Cutting the cervix.   An air bubble to the lung (air embolus).   Pregnancy following the procedure.   Failure of the procedure to help the problem requiring hysterectomy.   Decreased ability to diagnose  cancer in the lining of the uterus.  BEFORE THE PROCEDURE  The lining of the uterus must be tested to make sure there is no pre-cancerous or cancer cells present.   Medications may be given to make the lining of the uterus thinner.   Ultrasound may be used to evaluate the size and look for abnormalities of the uterus.   Future pregnancy is not desired.  PROCEDURE  There are different ways to destroy the lining of the uterus.   Resectoscope - radio frequency-alternating electric current is the most common one used.   Cryotherapy - freezing the lining of the uterus.   Heated Free Liquid - heated salt (saline) solution inserted into the uterus.   Microwave - uses high energy microwaves in the uterus.   Thermal Balloon - a catheter with a balloon tip is inserted into the uterus and filled with heated fluid.  Your caregiver will talk with you about the method used in this clinic. They will also instruct you on the pros and cons of the procedure. Endometrial ablation is performed along with a procedure called operative hysteroscopy. A narrow viewing tube is inserted through the birth canal (vagina) and through the cervix into the uterus. A tiny camera attached to the viewing tube (hysteroscope) allows the uterine cavity to be shown on a TV monitor during surgery. Your uterus is filled with a harmless liquid to make the procedure easier. The lining of the uterus is then removed. The lining can also be removed with a resectoscope which allows your surgeon   to cut away the lining of the uterus under direct vision. Usually, you will be able to go home within an hour after the procedure. HOME CARE INSTRUCTIONS   Do not drive for 24 hours.   No tampons, douching or intercourse for 2 weeks or until your caregiver approves.   Rest at home for 24 to 48 hours. You may then resume normal activities unless told differently by your caregiver.   Take your temperature two times a day for 4 days, and record  it.   Take any medications your caregiver has ordered, as directed.   Use some form of contraception if you are pre-menopausal and do not want to get pregnant.  Bleeding after the procedure is normal. It varies from light spotting and mildly watery to bloody discharge for 4 to 6 weeks. You may also have mild cramping. Only take over-the-counter or prescription medicines for pain, discomfort, or fever as directed by your caregiver. Do not use aspirin, as this may aggravate bleeding. Frequent urination during the first 24 hours is normal. You will not know how effective your surgery is until at least 3 months after the surgery. SEEK IMMEDIATE MEDICAL CARE IF:   Bleeding is heavier than a normal menstrual cycle.   An oral temperature above 102 F (38.9 C) develops.   You have increasing cramps or pains not relieved with medication or develop belly (abdominal) pain which does not seem to be related to the same area of earlier cramping and pain.   You are light headed, weak or have fainting episodes.   You develop pain in the shoulder strap areas.   You have chest or leg pain.   You have abnormal vaginal discharge.   You have painful urination.  Document Released: 01/19/2004 Document Revised: 02/28/2011 Document Reviewed: 04/18/2007 [employer] Patient Information 2012 [employer]. 

## 2011-11-19 ENCOUNTER — Inpatient Hospital Stay (HOSPITAL_COMMUNITY): Admission: RE | Admit: 2011-11-19 | Payer: Medicaid Other | Source: Ambulatory Visit

## 2011-11-19 ENCOUNTER — Encounter (HOSPITAL_COMMUNITY): Payer: Self-pay | Admitting: Pharmacist

## 2011-11-20 ENCOUNTER — Inpatient Hospital Stay (HOSPITAL_COMMUNITY): Admission: RE | Admit: 2011-11-20 | Payer: Medicaid Other | Source: Ambulatory Visit

## 2011-11-20 ENCOUNTER — Other Ambulatory Visit (HOSPITAL_COMMUNITY): Payer: Medicaid Other

## 2011-12-02 ENCOUNTER — Inpatient Hospital Stay (HOSPITAL_COMMUNITY): Admission: RE | Admit: 2011-12-02 | Payer: Medicaid Other | Source: Ambulatory Visit

## 2011-12-09 ENCOUNTER — Ambulatory Visit (HOSPITAL_COMMUNITY)
Admission: RE | Admit: 2011-12-09 | Payer: Medicaid Other | Source: Ambulatory Visit | Admitting: Obstetrics & Gynecology

## 2011-12-09 ENCOUNTER — Encounter (HOSPITAL_COMMUNITY): Admission: RE | Payer: Self-pay | Source: Ambulatory Visit

## 2011-12-09 SURGERY — DILATATION & CURETTAGE/HYSTEROSCOPY WITH HYDROTHERMAL ABLATION
Anesthesia: Choice | Site: Vagina

## 2013-05-08 IMAGING — CT CT ABD-PELV W/ CM
2 of 5 series · 12 of 32 positions shown, 17 images · IV contrast (omnipaque)
Comparison: Abdominal radiograph performed earlier today at [DATE]
p.m.

CLINICAL DATA: Bilateral mid to lower abdominal pain, nausea and
vomiting.

CT ABDOMEN AND PELVIS WITH CONTRAST
TECHNIQUE: Multidetector CT imaging of the abdomen and pelvis was
performed following the standard protocol during bolus
administration of intravenous contrast.
Contrast:  100 mL of Omnipaque 300 IV contrast

[Series 2: routine abdomen · axial · 0.70mm/px · z∈[-395,-140]mm · 4 of 86 slices shown, 9 images]
[im 18/86  soft-tissue]
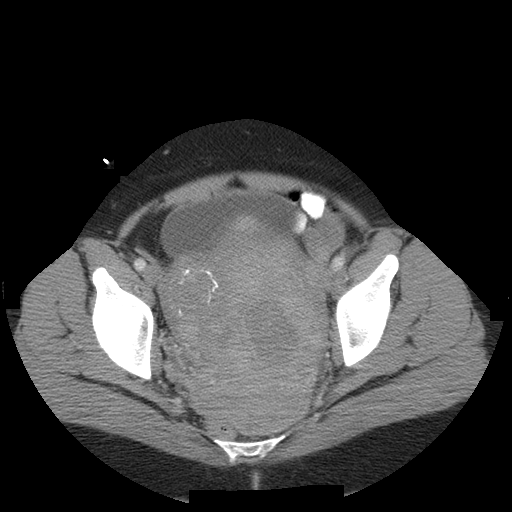
[im 18/86  lung]
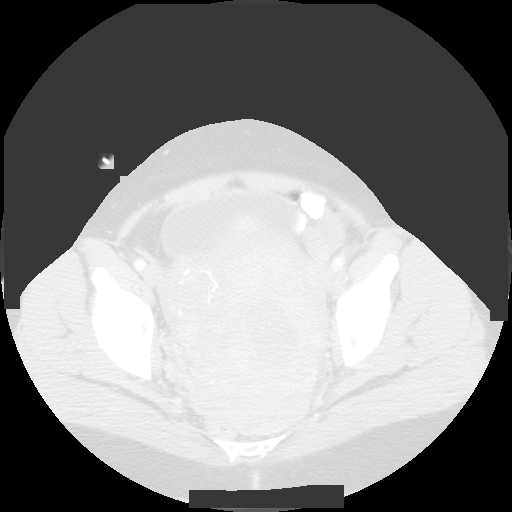
[im 18/86  bone]
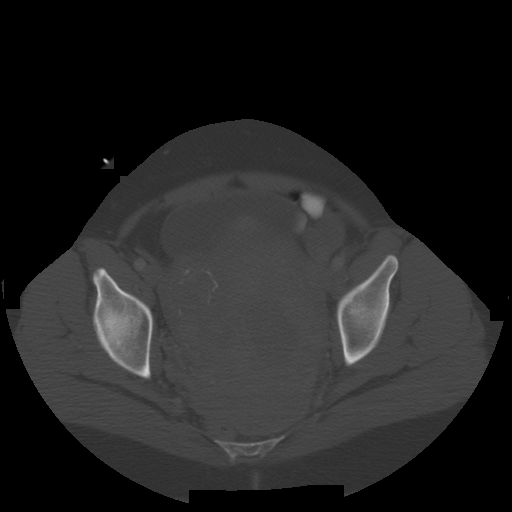
[im 35/86  soft-tissue]
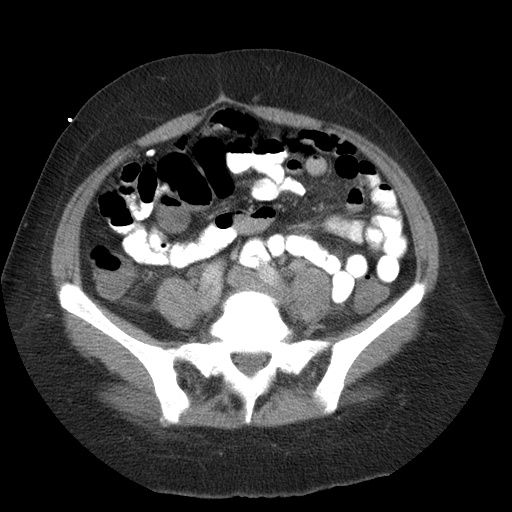
[im 35/86  lung]
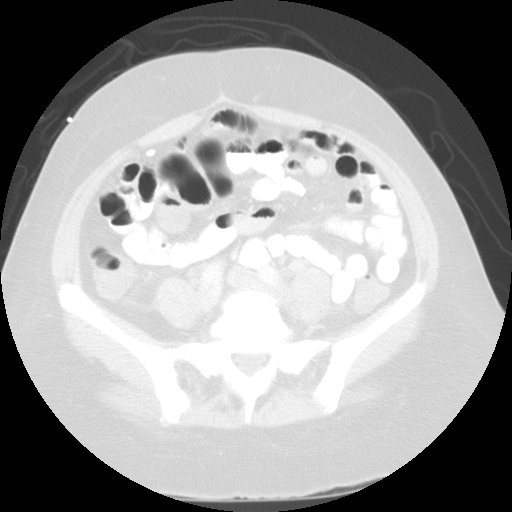
[im 52/86  soft-tissue]
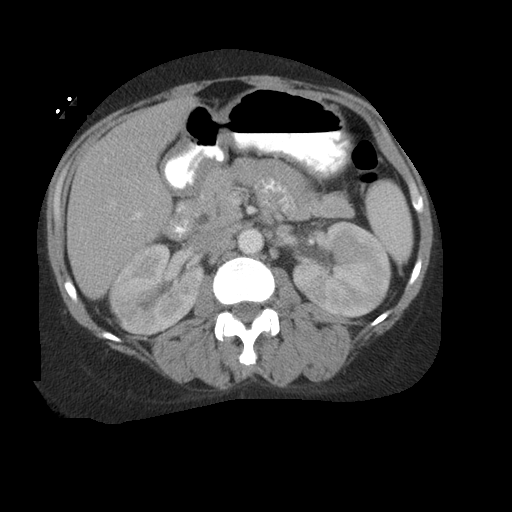
[im 52/86  lung]
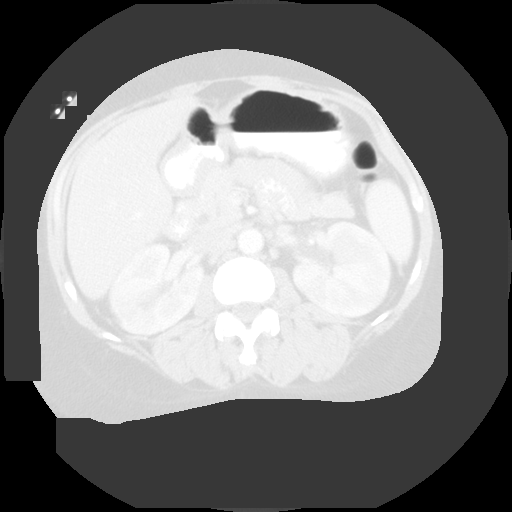
[im 69/86  soft-tissue]
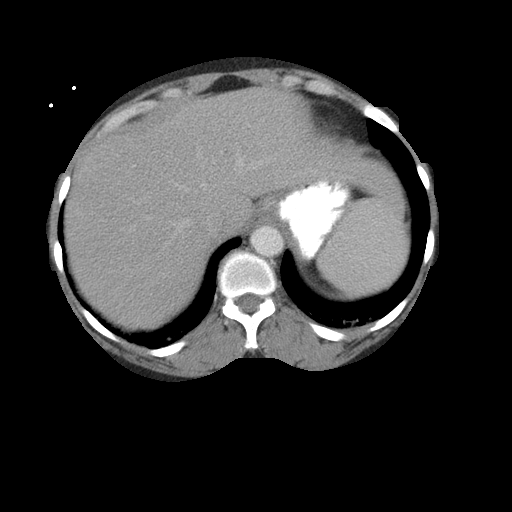
[im 69/86  lung]
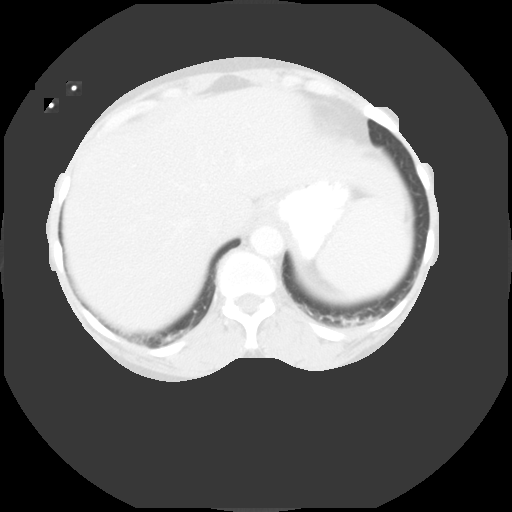

[Series 401: sag · sagittal · 0.86mm/px · 8 of 173 slices shown]
[im 18/173  soft-tissue]
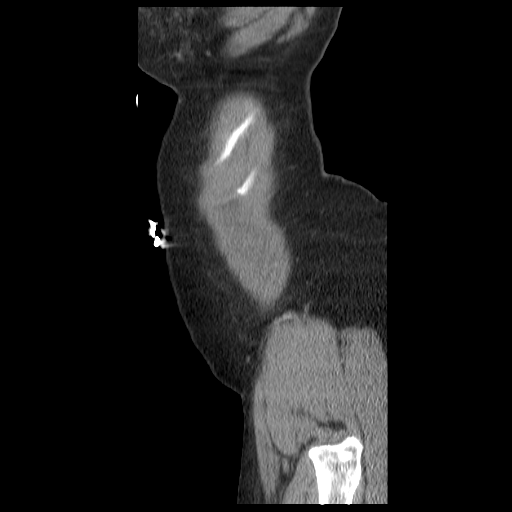
[im 35/173  soft-tissue]
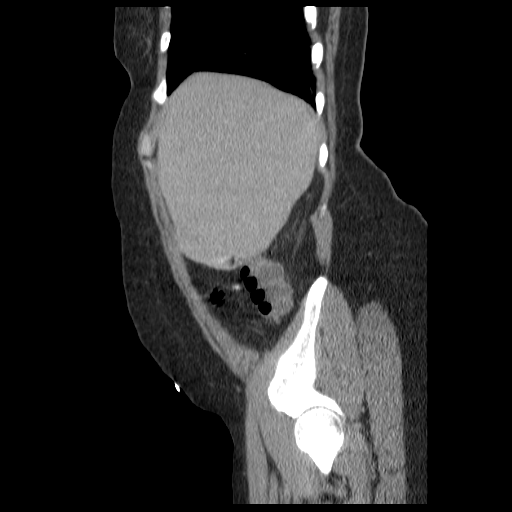
[im 52/173  soft-tissue]
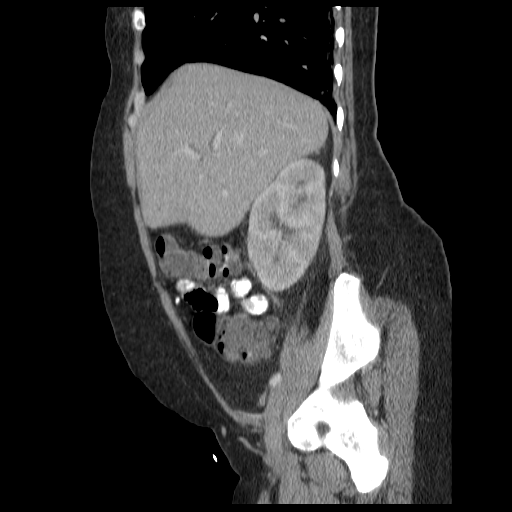
[im 69/173  soft-tissue]
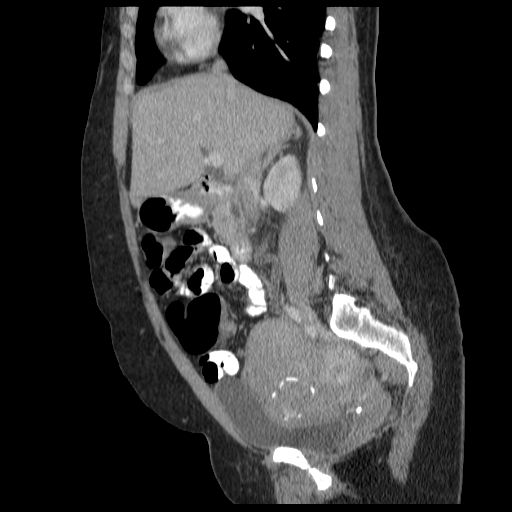
[im 104/173  soft-tissue]
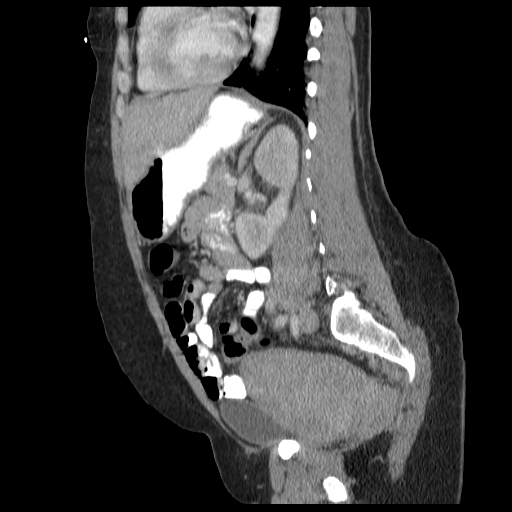
[im 121/173  soft-tissue]
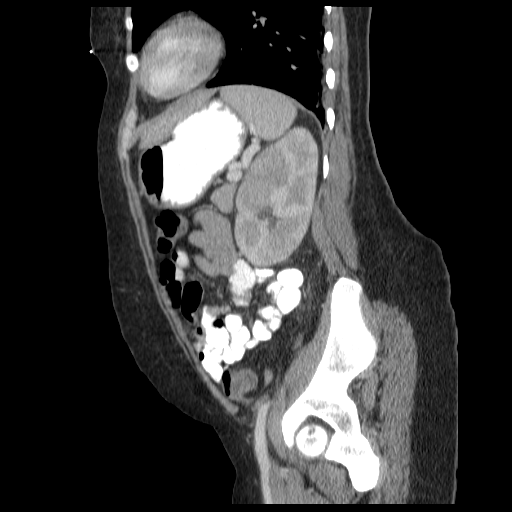
[im 138/173  soft-tissue]
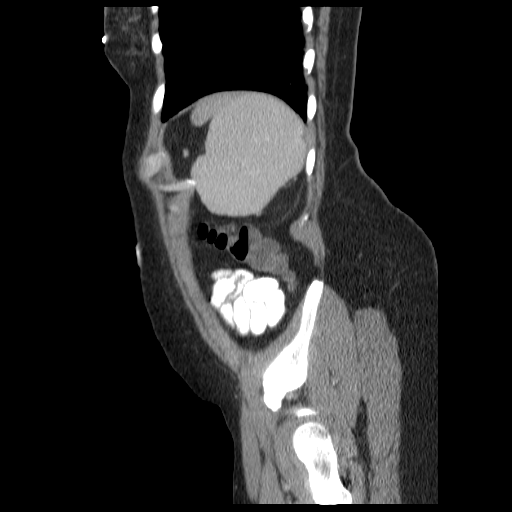
[im 155/173  soft-tissue]
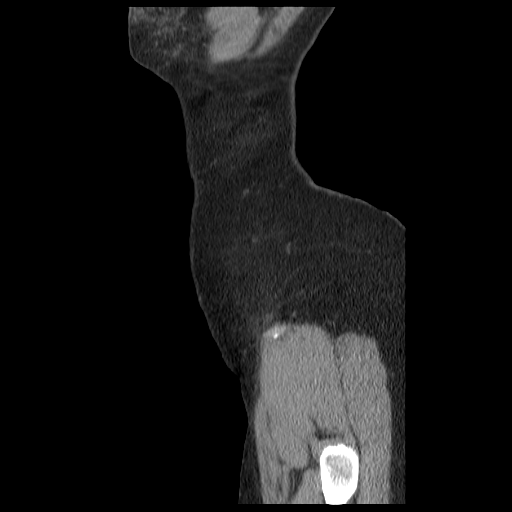

[12 of 32 positions shown; findings below may reference images not displayed]

FINDINGS: Minimal bibasilar atelectasis is noted.

Minimal prominence of the intrahepatic biliary ducts remains within
normal limits status post cholecystectomy.  The liver is otherwise
unremarkable in appearance.  The spleen is unremarkable.  Clips are
noted at the gallbladder fossa.  The pancreas and adrenal glands
are unremarkable.

There is diffuse heterogeneity and decreased enhancement of both
kidneys, compatible with bilateral pyelonephritis.  No focal masses
are identified.  Minimal associated perinephric stranding is noted.
There is no evidence of hydronephrosis.  No renal or ureteral
stones are identified.

No free fluid is identified.  The small bowel is unremarkable in
appearance.  The stomach is within normal limits.  No acute
vascular abnormalities are seen.

The appendix is normal in caliber and contains minimal air, without
evidence for appendicitis.  The colon is grossly unremarkable in
appearance.

The bladder is mildly distended, though displaced by the patient's
enlarged fibroid uterus.  A significantly enlarged uterus is noted,
largely filling the pelvis and extending to the level of the
umbilicus, with multiple prominent heterogeneous and partially
calcified fibroids.  No inguinal lymphadenopathy is seen.

No acute osseous abnormalities are identified.  Vacuum phenomenon
and disc space narrowing are noted at L5-S1, with associated
endplate irregularity and sclerosis.
IMPRESSION: 1.  Diffuse acute bilateral pyelonephritis noted.
2.  Significantly enlarged fibroid uterus largely fills the pelvis
and extends to the level of the umbilicus.
3.  Mild degenerative change at L5-S1.

## 2014-01-24 ENCOUNTER — Encounter: Payer: Self-pay | Admitting: Obstetrics & Gynecology

## 2019-04-28 ENCOUNTER — Emergency Department (HOSPITAL_BASED_OUTPATIENT_CLINIC_OR_DEPARTMENT_OTHER): Payer: Medicaid Other

## 2019-04-28 ENCOUNTER — Encounter (HOSPITAL_BASED_OUTPATIENT_CLINIC_OR_DEPARTMENT_OTHER): Payer: Self-pay

## 2019-04-28 ENCOUNTER — Emergency Department (HOSPITAL_BASED_OUTPATIENT_CLINIC_OR_DEPARTMENT_OTHER)
Admission: EM | Admit: 2019-04-28 | Discharge: 2019-04-28 | Discharge status: Against Medical Advice | Payer: Medicaid Other | Attending: Emergency Medicine | Admitting: Emergency Medicine

## 2019-04-28 ENCOUNTER — Other Ambulatory Visit: Payer: Self-pay

## 2019-04-28 DIAGNOSIS — Z20822 Contact with and (suspected) exposure to covid-19: Secondary | ICD-10-CM | POA: Insufficient documentation

## 2019-04-28 DIAGNOSIS — R05 Cough: Secondary | ICD-10-CM | POA: Diagnosis not present

## 2019-04-28 DIAGNOSIS — Z87891 Personal history of nicotine dependence: Secondary | ICD-10-CM | POA: Diagnosis not present

## 2019-04-28 DIAGNOSIS — R0602 Shortness of breath: Secondary | ICD-10-CM | POA: Diagnosis not present

## 2019-04-28 DIAGNOSIS — M791 Myalgia, unspecified site: Secondary | ICD-10-CM | POA: Insufficient documentation

## 2019-04-28 DIAGNOSIS — R059 Cough, unspecified: Secondary | ICD-10-CM

## 2019-04-28 DIAGNOSIS — Z5320 Procedure and treatment not carried out because of patient's decision for unspecified reasons: Secondary | ICD-10-CM | POA: Insufficient documentation

## 2019-04-28 DIAGNOSIS — I10 Essential (primary) hypertension: Secondary | ICD-10-CM | POA: Insufficient documentation

## 2019-04-28 DIAGNOSIS — R52 Pain, unspecified: Secondary | ICD-10-CM

## 2019-04-28 HISTORY — DX: Alcohol dependence, uncomplicated: F10.20

## 2019-04-28 LAB — SARS CORONAVIRUS 2 AG (30 MIN TAT): SARS Coronavirus 2 Ag: NEGATIVE

## 2019-04-28 MED ORDER — ACETAMINOPHEN 500 MG PO TABS: 1000.0000 mg | ORAL_TABLET | Freq: Once | ORAL | Status: DC

## 2019-04-28 MED ORDER — KETOROLAC TROMETHAMINE 60 MG/2ML IM SOLN
60.0000 mg | Freq: Once | INTRAMUSCULAR | Status: DC
  Filled 2019-04-28: qty 2

## 2019-04-28 MED ORDER — OXYCODONE-ACETAMINOPHEN 5-325 MG PO TABS: 1.0000 | ORAL_TABLET | Freq: Once | ORAL | Status: DC

## 2019-04-28 NOTE — ED Notes (Signed)
Pt refused Toradol & Tylenol, verbally aggressive towards staff and provider, stating she wants to go elsewhere. Willing to sign out AMA, NAD, A&Ox4, pt requested wheelchair due to 'inability to breathe', O2 sat 99% RA. This RN assisted pt with wheelchair into lobby where pt wanted to be wheeled to door. Pt stood and walked out with no distress.

## 2019-04-28 NOTE — ED Notes (Signed)
Per reg, pt requested to be checked on in ED WR for c/o SOB-pt cont'd NAD-O2 sat 98- no resp distress-advised she would be taken to tx area ASAP

## 2019-04-28 NOTE — ED Notes (Signed)
Pt sts she would like something for pain before she tries to ambulate with pulse ox. EDP notified.

## 2019-04-28 NOTE — ED Provider Notes (Signed)
[facility] EMERGENCY DEPARTMENT Provider Note   CSN: [id number] Arrival date & time: 04/28/19  1554     History Chief Complaint  Patient presents with  . Cough    Brittany Dudley is a 54 y.o. female past medical history of anemia, depression, fibroid, hernia, hypertension who presents for evaluation of multiple complaints.  Patient reports that she has had generalized body aches, chills, cough, congestion, shortness of breath, diarrhea, abdominal cramping over the last 2 days.  She states that her whole body aches and just feels sore.  She states she has been tired and has been no energy.  She states that she has been coughing and states cough is productive of green phlegm.  She states that when she gets into a coughing fit, she feels like she cannot catch her breath.  She also reports some associated chest soreness when she coughs.  She also states she has had some abdominal cramping right before she has to have a bowel movement.  No blood in stools.  The abdominal cramping subsides but she still has generalized body aches.  States she has not measured any fever but has had chills.  She states that she is staying with her sister who is daughter just tested positive for Covid about 2 weeks ago.  No other known Covid exposure.  Denies any travel.  No history of COPD but does have a history of asthma.  The history is provided by the patient.       Past Medical History:  Diagnosis Date  . Alcohol dependence ([place])   . Anemia   . Depression   . Fibroids   . Hernia   . Hypertension   . Menorrhagia     Patient Active Problem List   Diagnosis Date Noted  . Fibroid, uterine 09/12/2011  . Pelvic pain 09/12/2011  . Alcoholism /alcohol abuse ([place]) 06/18/2011  . Tobacco abuse 06/18/2011    Past Surgical History:  Procedure Laterality Date  . ABDOMINAL HYSTERECTOMY    . CHOLECYSTECTOMY       OB History    Gravida  4   Para  3   Term  3   Preterm      AB  1   Living    3     SAB  1   TAB      Ectopic      Multiple      Live Births  3           Family History  Problem Relation Age of Onset  . Hypertension Mother   . Depression Mother   . Cancer Father   . Diabetes Father     Social History   Tobacco Use  . Smoking status: Former Smoker    Packs/day: 0.00  . Smokeless tobacco: Never Used  Substance Use Topics  . Alcohol use: Not Currently  . Drug use: No    Home Medications Prior to Admission medications   Medication Sig Start Date End Date Taking? Authorizing Provider  ibuprofen (ADVIL,MOTRIN) 200 MG tablet Take 200-800 mg by mouth every 6 (six) hours as needed. For pain    [provider]    Allergies    Ibuprofen and Toradol [ketorolac tromethamine]  Review of Systems   Review of Systems  Constitutional: Positive for appetite change, chills and fatigue. Negative for fever.  Respiratory: Positive for cough and shortness of breath.   Cardiovascular: Negative for chest pain.  Gastrointestinal: Positive for diarrhea. Negative for  abdominal pain, nausea and vomiting.  Genitourinary: Negative for dysuria and hematuria.  Musculoskeletal: Positive for myalgias.  Neurological: Negative for headaches.  All other systems reviewed and are negative.   Physical Exam Updated Vital Signs BP (!) 147/87 (BP Location: Right Arm)   Pulse 82   Temp 98.4 F (36.9 C) (Oral)   Resp 20   Ht 5\' 9"  (1.753 m)   Wt 98.4 kg   LMP 08/28/2011   SpO2 98%   BMI 32.05 kg/m   Physical Exam Vitals and nursing note reviewed.  Constitutional:      Appearance: Normal appearance. She is well-developed.     Comments: Appears uncomfortable but no acute distress   HENT:     Head: Normocephalic and atraumatic.  Eyes:     General: Lids are normal.     Conjunctiva/sclera: Conjunctivae normal.     Pupils: Pupils are equal, round, and reactive to light.  Cardiovascular:     Rate and Rhythm: Normal rate and regular rhythm.     Pulses:  Normal pulses.     Heart sounds: Normal heart sounds. No murmur. No friction rub. No gallop.   Pulmonary:     Effort: Pulmonary effort is normal.     Breath sounds: Normal breath sounds.     Comments: Lungs clear to auscultation bilaterally.  Symmetric chest rise.  No wheezing, rales, rhonchi. No evidence of respiratory distress.  Abdominal:     Palpations: Abdomen is soft. Abdomen is not rigid.     Tenderness: There is no abdominal tenderness. There is no guarding.     Comments: Abdomen is soft, non-distended, non-tender. No rigidity, No guarding. No peritoneal signs.  Musculoskeletal:        General: Normal range of motion.     Cervical back: Full passive range of motion without pain.  Skin:    General: Skin is warm and dry.     Capillary Refill: Capillary refill takes less than 2 seconds.  Neurological:     Mental Status: She is alert and oriented to person, place, and time.  Psychiatric:        Speech: Speech normal.     ED Results / Procedures / Treatments   Labs (all labs ordered are listed, but only abnormal results are displayed) Labs Reviewed  SARS CORONAVIRUS 2 AG (30 MIN TAT)    EKG None  Radiology DG Chest Portable 1 View  Result Date: 04/28/2019 CLINICAL DATA:  Cough x2 days. EXAM: PORTABLE CHEST 1 VIEW COMPARISON:  October 02, 2010 FINDINGS: There is no evidence of acute infiltrate, pleural effusion or pneumothorax. The heart size and mediastinal contours are within normal limits. The visualized skeletal structures are unremarkable. IMPRESSION: No active disease. Electronically Signed   By: Virgina Norfolk M.D.   On: 04/28/2019 17:26    Procedures Procedures (including critical care time)  Medications Ordered in ED Medications  ketorolac (TORADOL) injection 60 mg (60 mg Intramuscular Not Given 04/28/19 1726)  acetaminophen (TYLENOL) tablet 1,000 mg (has no administration in time range)    ED Course  I have reviewed the triage vital signs and the nursing  notes.  Pertinent labs & imaging results that were available during my care of the patient were reviewed by me and considered in my medical decision making (see chart for details).    MDM Rules/Calculators/A&P                      54 year old female presents for evaluation of  2 days of generalized body aches, cough, congestion, fever/chills, shortness of breath, chest soreness.  Reports recent Covid exposure.  No travel.  Initially arrival, she is afebrile, nontoxic-appearing.  Vitals appear stable.  Her abdomen exam is benign.  I palpate her abdomen while she is still talking to me.  No rigidity, guarding.  She reports mostly having the abdominal pain right before she has a bowel movement.  Evidence of respiratory distress.  Concern for infectious etiology such as COVID-19.  History/physical exam not concerning for ACS and etiology.  Plan to check Covid.  Chest x-ray was negative for any acute bony abnormality.  No evidence of infectious etiology.  RN inform me that patient stated that she was allergic to Toradol.  This was not mentioned in her initial allergy report.  I ordered Tylenol as patient had driven herself here and was concerned about giving her any sedative medications.  RN informed me that patient refused all medications.  They attempted to ambulate to check her pulse ox.  Her O2 sats had maintained in the high 90s since being here in arrival.  She exhibited no evidence of respiratory distress and her lungs were clear to auscultation bilaterally.  Patient refused to ambulate while checking O2 sats until she got something to help her body aches.  I discussed with nurse that they could give Tylenol as I was concerned about the sedative medication.  Patient informed nurse that she wanted to leave.  Patient was told that she would be leaving [place].  Patient appeared clinically sober and exhibits full medical medical decision-making capacity.  Ane Method was evaluated in  Emergency Department on 04/28/2019 for the symptoms described in the history of present illness. She was evaluated in the context of the global COVID-19 pandemic, which necessitated consideration that the patient might be at risk for infection with the SARS-CoV-2 virus that causes COVID-19. Institutional protocols and algorithms that pertain to the evaluation of patients at risk for COVID-19 are in a state of rapid change based on information released by regulatory bodies including the CDC and federal and state organizations. These policies and algorithms were followed during the patient's care in the ED.  Portions of this note were generated with [employer]. Dictation errors may occur despite best attempts at proofreading.   Final Clinical Impression(s) / ED Diagnoses Final diagnoses:  Cough  Shortness of breath  Generalized body aches    Rx / DC Orders ED Discharge Orders    None       Desma Mcgregor 04/28/19 [id number]    Lucrezia Starch, MD 04/30/19 [phone]

## 2019-04-28 NOTE — ED Triage Notes (Addendum)
Pt c/o flu like sx x 2 days with +covid exposure-NAD-to triage in w/c-states drove self to ED and ambulated in ED [place] door

## 2019-04-28 NOTE — Discharge Instructions (Addendum)
You have a Covid test pending.  You can check on the results.  Your chest x-ray appeared normal.
